# Patient Record
Sex: Female | Born: 1995 | Race: White | Hispanic: No | Marital: Single | Smoking: Never smoker
Health system: Southern US, Community
[De-identification: ages and names within clinical notes are randomized; demographics above are authoritative.]

## PROBLEM LIST (undated history)

## (undated) DIAGNOSIS — R6250 Unspecified lack of expected normal physiological development in childhood: Secondary | ICD-10-CM

## (undated) DIAGNOSIS — R569 Unspecified convulsions: Secondary | ICD-10-CM

## (undated) DIAGNOSIS — E039 Hypothyroidism, unspecified: Secondary | ICD-10-CM

## (undated) DIAGNOSIS — H547 Unspecified visual loss: Secondary | ICD-10-CM

## (undated) HISTORY — PX: OTHER SURGICAL HISTORY: SHX169

## (undated) HISTORY — DX: Unspecified convulsions: R56.9

## (undated) HISTORY — DX: Unspecified visual loss: H54.7

## (undated) HISTORY — DX: Hypothyroidism, unspecified: E03.9

## (undated) HISTORY — DX: Unspecified lack of expected normal physiological development in childhood: R62.50

---
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
## 2015-01-14 ENCOUNTER — Encounter: Payer: Self-pay | Admitting: Family Medicine

## 2015-01-14 ENCOUNTER — Ambulatory Visit (INDEPENDENT_AMBULATORY_CARE_PROVIDER_SITE_OTHER): Payer: Medicaid Other | Admitting: Family Medicine

## 2015-01-14 VITALS — BP 75/55 | HR 63 | Temp 95.9°F | Wt <= 1120 oz

## 2015-01-14 DIAGNOSIS — G801 Spastic diplegic cerebral palsy: Secondary | ICD-10-CM

## 2015-01-14 DIAGNOSIS — Z23 Encounter for immunization: Secondary | ICD-10-CM | POA: Diagnosis not present

## 2015-01-14 DIAGNOSIS — E038 Other specified hypothyroidism: Secondary | ICD-10-CM

## 2015-01-14 NOTE — Progress Notes (Signed)
Subjective:  Patient ID: Abigail Hartman, female    DOB: [birth year]  Age: 19 y.o. MRN: [id number]  CC: Weight Gain   HPI Abigail Hartman presents for weight gain irregular menses.Size went from 7-8 to 12 in 1.5 mos this is just since she was taken off her thyroid medicine. Mom was told that the patient is having periods that she did not need to be on thyroid medication if no other known symptoms of thyroid disease have been expressed in that activity has not been changed although activities very much limited by being wheelchair-bound by her cerebral palsy. There is no excessive constipation. There is no hair loss.  History Hatsue has a past medical history of Blind; Seizures; Hypothyroidism; and Developmental delay, severe.   She has past surgical history that includes GI TUBE (2011).   Her family history is not on file.She reports that she has been passively smoking.  She has never used smokeless tobacco. She reports that she does not drink alcohol or use illicit drugs.  Outpatient Prescriptions Prior to Visit  Medication Sig Dispense Refill  . acetaminophen (TYLENOL) 160 MG/5ML solution Take 240 mg by mouth every 6 (six) hours as needed for fever.    . Amantadine HCl 100 MG tablet Take 0.5 tablets (50 mg total) by mouth 2 (two) times daily. 31 tablet 5  . cloNIDine HCl (KAPVAY) 0.1 MG TB12 ER tablet 1 TAB AM, 1 TAB PM, 2 TABS QHS 124 tablet 5  . diazepam (DIASTAT ACUDIAL) 10 MG GEL Place 5 mg rectally.    . Ergocalciferol (VITAMIN D2) 2000 UNITS TABS Take by mouth.    . hydrOXYzine (ATARAX/VISTARIL) 25 MG tablet One tablet by mouth 3 times daily as needed 90 tablet 5  . nystatin cream (MYCOSTATIN) Apply topically 2 (two) times daily. 30 g 5  . Pseudoephedrine-APAP-DM (DAYQUIL PO) Take 10 mLs by mouth daily as needed (cold).    . ranitidine (ZANTAC) 150 MG tablet TAKE ONE TABLET BY MOUTH TWICE DAILY 60 tablet 5  . tizanidine (ZANAFLEX) 2 MG capsule Take 1 capsule at nighttime 31 capsule 5  .  zonisamide (ZONEGRAN) 100 MG capsule Take 2 capsules at nighttime 62 capsule 5   No facility-administered medications prior to visit.    ROS Review of Systems  Constitutional: Negative for fever, activity change and appetite change.  HENT: Negative for congestion, rhinorrhea and sore throat.   Eyes: Negative for pain and visual disturbance.  Respiratory: Negative for cough and shortness of breath.   Gastrointestinal: Negative for nausea and abdominal pain.  Musculoskeletal: Negative for myalgias and arthralgias.    Objective:  BP 75/55 mmHg  Pulse 63  Temp(Src) 95.9 F (35.5 C) (Axillary)  Wt 61 lb (27.669 kg)  LMP 12/18/2014  BP Readings from Last 3 Encounters:  01/14/15 75/55  12/18/14 84/62  12/03/14 82/56    Wt Readings from Last 3 Encounters:  01/14/15 61 lb (27.669 kg) (0 %*, Z = -8.58)  12/18/14 65 lb (29.484 kg) (0 %*, Z = -7.41)  12/03/14 61 lb (27.669 kg) (0 %*, Z = -8.65)   * Growth percentiles are based on CDC 2-20 Years data.     Physical Exam  Constitutional: She appears well-developed and well-nourished. No distress.  Small for age. Wheelchair bound. Safety belted due to lack of coordination. Smiling bright eyed with head swaying from side-to-side constantly rhythmically through the whole visit. No other form of communication.  HENT:  Head: Normocephalic and atraumatic.  Eyes: Conjunctivae are normal.  Pupils are equal, round, and reactive to light.  Neck: Normal range of motion. Neck supple. No thyromegaly present.  Cardiovascular: Normal rate, regular rhythm and normal heart sounds.   No murmur heard. Pulmonary/Chest: Effort normal and breath sounds normal. No respiratory distress. She has no wheezes. She has no rales.  Abdominal: Soft. Bowel sounds are normal. She exhibits no distension. There is no tenderness.  Musculoskeletal: Normal range of motion.  Lymphadenopathy:    She has no cervical adenopathy.  Neurological: She is alert. She exhibits  abnormal muscle tone. Coordination abnormal.  Skin: Skin is warm and dry.  Psychiatric: Her affect is labile. Cognition and memory are impaired. She is noncommunicative.    No results found for: HGBA1C  Lab Results  Component Value Date   WBC 7.8 12/18/2014   HGB 11.5* 12/18/2014   HCT 35.1* 12/18/2014   PLT 304 12/18/2014   GLUCOSE 99 12/18/2014   ALT 18 12/18/2014   AST 22 12/18/2014   NA 137 12/18/2014   K 3.9 12/18/2014   CL 109 12/18/2014   CREATININE 0.35* 12/18/2014   BUN 16 12/18/2014   CO2 22 12/18/2014    Dg Chest Portable 1 View  12/18/2014  CLINICAL DATA:  Unresponsive, seizure, cough last week EXAM: PORTABLE CHEST - 1 VIEW COMPARISON:  03/03/2004 FINDINGS: The heart size and mediastinal contours are within normal limits. Both lungs are clear. The visualized skeletal structures are unremarkable. IMPRESSION: No active disease. Electronically Signed   By: Skipper Cliche M.D.   On: 12/18/2014 09:29    Assessment & Plan:   Eknoor was seen today for weight gain.  Diagnoses and all orders for this visit:  Other specified hypothyroidism -     T4, Free -     CBC with Differential/Platelet -     CMP14+EGFR -     TSH  Encounter for immunization  Hypoxic ischemic encephalopathy, neonatal onset, severe  Spastic diplegia (HCC)  Other orders -     Flu Vaccine QUAD 36+ mos IM   I am having Ms. Herro maintain her Vitamin D2, diazepam, hydrOXYzine, nystatin cream, ranitidine, zonisamide, tizanidine, cloNIDine HCl, Amantadine HCl, acetaminophen, and Pseudoephedrine-APAP-DM (DAYQUIL PO).  No orders of the defined types were placed in this encounter.     Follow-up: Return if symptoms worsen or fail to improve.  Abigail Hartman, M.D.

## 2015-01-15 ENCOUNTER — Telehealth: Payer: Self-pay | Admitting: Family Medicine

## 2015-01-15 ENCOUNTER — Telehealth: Payer: Self-pay | Admitting: *Deleted

## 2015-01-15 ENCOUNTER — Other Ambulatory Visit: Payer: Self-pay | Admitting: Family Medicine

## 2015-01-15 LAB — CBC WITH DIFFERENTIAL/PLATELET
BASOS ABS: 0 10*3/uL (ref 0.0–0.2)
BASOS: 0 %
EOS (ABSOLUTE): 0.2 10*3/uL (ref 0.0–0.4)
Eos: 4 %
Hematocrit: 38.2 % (ref 34.0–46.6)
Hemoglobin: 12.3 g/dL (ref 11.1–15.9)
IMMATURE GRANS (ABS): 0 10*3/uL (ref 0.0–0.1)
IMMATURE GRANULOCYTES: 0 %
LYMPHS: 31 %
Lymphocytes Absolute: 1.9 10*3/uL (ref 0.7–3.1)
MCH: 27.9 pg (ref 26.6–33.0)
MCHC: 32.2 g/dL (ref 31.5–35.7)
MCV: 87 fL (ref 79–97)
MONOS ABS: 0.5 10*3/uL (ref 0.1–0.9)
Monocytes: 9 %
NEUTROS PCT: 56 %
Neutrophils Absolute: 3.5 10*3/uL (ref 1.4–7.0)
PLATELETS: 286 10*3/uL (ref 150–379)
RBC: 4.41 x10E6/uL (ref 3.77–5.28)
RDW: 14.9 % (ref 12.3–15.4)
WBC: 6.2 10*3/uL (ref 3.4–10.8)

## 2015-01-15 LAB — CMP14+EGFR
A/G RATIO: 1.6 (ref 1.1–2.5)
ALT: 16 IU/L (ref 0–32)
AST: 23 IU/L (ref 0–40)
Albumin: 4.4 g/dL (ref 3.5–5.5)
Alkaline Phosphatase: 146 IU/L — ABNORMAL HIGH (ref 39–117)
BILIRUBIN TOTAL: 0.2 mg/dL (ref 0.0–1.2)
BUN/Creatinine Ratio: 24 — ABNORMAL HIGH (ref 8–20)
BUN: 13 mg/dL (ref 6–20)
CALCIUM: 9.4 mg/dL (ref 8.7–10.2)
CHLORIDE: 102 mmol/L (ref 97–106)
CO2: 21 mmol/L (ref 18–29)
Creatinine, Ser: 0.55 mg/dL — ABNORMAL LOW (ref 0.57–1.00)
GFR calc Af Amer: 157 mL/min/{1.73_m2} (ref >59)
GFR calc non Af Amer: 136 mL/min/{1.73_m2} (ref >59)
GLUCOSE: 93 mg/dL (ref 65–99)
Globulin, Total: 2.8 g/dL (ref 1.5–4.5)
POTASSIUM: 4.2 mmol/L (ref 3.5–5.2)
Sodium: 141 mmol/L (ref 136–144)
TOTAL PROTEIN: 7.2 g/dL (ref 6.0–8.5)

## 2015-01-15 LAB — T4, FREE: FREE T4: 0.66 ng/dL — AB (ref 0.93–1.60)

## 2015-01-15 LAB — TSH: TSH: 5.17 u[IU]/mL — AB (ref 0.450–4.500)

## 2015-01-15 MED ORDER — LEVOTHYROXINE SODIUM 50 MCG PO TABS: 50.0000 ug | ORAL_TABLET | Freq: Every day | ORAL | Status: DC

## 2015-01-15 NOTE — Telephone Encounter (Signed)
-----   Message from Mechele ClaudeWarren Stacks, MD sent at 01/15/2015  9:57 AM EDT -----  thyroid result shows that your thyroid does not produce enough thyroxine, the thyroid hormone that gives you energy. I have sent in levothyroxin 50 g daily this should help with the patient's weight gain.. I would like to see her again in approximately 6-8 weeks for this. Come by a couple of days early if possible to have your blood test, drawn so we can go over the results when you arrive for your appointment.   Nurse, please put in future order for TSH and free T4. Thanks, WS.  Best Regards, Mechele ClaudeWarren Stacks, M.D.

## 2015-01-16 NOTE — Addendum Note (Signed)
Addended by: Tamera PuntWRAY, Darcy Cordner S on: 01/16/2015 08:49 AM   Modules accepted: Orders

## 2015-01-16 NOTE — Telephone Encounter (Signed)
Patient mom aware of results.

## 2015-02-14 ENCOUNTER — Other Ambulatory Visit: Payer: Self-pay | Admitting: Pediatrics

## 2015-03-15 ENCOUNTER — Other Ambulatory Visit: Payer: Self-pay | Admitting: Nurse Practitioner

## 2015-06-02 ENCOUNTER — Ambulatory Visit (INDEPENDENT_AMBULATORY_CARE_PROVIDER_SITE_OTHER): Payer: Medicaid Other | Admitting: Pediatrics

## 2015-06-02 ENCOUNTER — Encounter: Payer: Self-pay | Admitting: Pediatrics

## 2015-06-02 VITALS — BP 86/58 | HR 106 | Temp 97.4°F | Ht <= 58 in | Wt <= 1120 oz

## 2015-06-02 DIAGNOSIS — Z0000 Encounter for general adult medical examination without abnormal findings: Secondary | ICD-10-CM

## 2015-06-02 NOTE — Progress Notes (Signed)
    Subjective:    Patient ID: Abigail Hartman, female    DOB: 23-Nov-1995, 20 y.o.   MRN: [id number]  CC: special olympic form   HPI: Abigail Hartman is a 20 y.o. female presenting for special olympic form Pt with h/o cerebral palsy and septic-optic nerve dysplasia, developmental delay.  Pt can walk, can raise leg to get into bathtub In wheel chair much of the time Sometimes can walk on her own, gets tired, sometimes will sit down  No seizures since leaving hospital 5 yrs ago for double pneumonia  Eating by mouth every 2-3 hours Gtube removed, had been placed during prolonged hospitalization when failed swallow test Now with much better control of swallow  Non-verbal, will yell when she needs diaper change  Recognizes her GM and GF voices. Sleep as improved.  UTD immunizations  Hypothyroid: followed at [facility]  Septic optic-dysplasia: followed by Dr. Sharene SkeansHickling  Contracture L upper arm, L side more affected than R per GM who is adopted parent and guardian.  She is having regular periods, tend to be very light. GM had thought about birth control but now that the periods are light she says they dont seem to bother [name] and are manageable.  Relevant past medical, surgical, family and social history reviewed and updated as indicated. Interim medical history since our last visit reviewed. Allergies and medications reviewed and updated.    ROS: Per HPI unless specifically indicated above  History  Smoking status  . Passive Smoke Exposure - Never Smoker  Smokeless tobacco  . Never Used    Past Medical History Patient Active Problem List   Diagnosis Date Noted  . Spastic diplegia (HCC) 12/03/2014  . Epilepsy, generalized, convulsive (HCC) 12/03/2014  . Insomnia 12/03/2014  . Cerebral palsy (HCC) 05/07/2014  . Hypoxic ischemic encephalopathy, neonatal onset, severe 04/26/2014  . Optic nerve hypoplasia of both eyes 04/26/2014  . Blindness 04/26/2014  . Severe intellectual  disability 04/26/2014  . GERD (gastroesophageal reflux disease) 07/18/2012  . Seizures (HCC) 07/18/2012  . Hypothyroidism 07/18/2012        Objective:    BP 86/58 mmHg  Pulse 106  Temp(Src) 97.4 F (36.3 C) (Oral)  Ht 4' (1.219 m)  Wt 62 lb (28.123 kg)  BMI 18.93 kg/m2  Wt Readings from Last 3 Encounters:  06/02/15 62 lb (28.123 kg) (0 %*, Z = -8.05)  01/14/15 61 lb (27.669 kg) (0 %*, Z = -8.58)  12/18/14 65 lb (29.484 kg) (0 %*, Z = -7.41)   * Growth percentiles are based on CDC 2-20 Years data.     Gen: In wheelchair, non verbral, responds to grandmothers voice, smiling, eyes roving constantly, in NAD, alert.  EYES: no scleral injection or icterus ENT:  TMs pearly gray b/l, OP without erythema LYMPH: no cervical LAD CV: NRRR, normal S1/S2, no murmur, distal pulses 2+ b/l Resp: CTABL, no wheezes, normal WOB Abd: +BS, soft, NTND.  Ext: No edema, warm Neuro: follows some simple direction directions such as open your mouth, moves all extremities, can't straighten L arm, no clonus     Assessment & Plan:    Kaelen was seen today for special olympic form and WCC. Form completed. GM's questions answered.   Diagnoses and all orders for this visit:  Health check for child over 7328 days old  Follow up plan: 1 yr for yearly exam, sooner if needed  Rex Krasarol Josemiguel Gries, MD [facility] 06/02/2015, 3:03 PM

## 2015-06-13 ENCOUNTER — Other Ambulatory Visit: Payer: Self-pay | Admitting: Pediatrics

## 2015-06-30 ENCOUNTER — Ambulatory Visit: Payer: Medicaid Other | Admitting: Pediatrics

## 2015-07-13 ENCOUNTER — Other Ambulatory Visit: Payer: Self-pay | Admitting: Family Medicine

## 2015-07-18 ENCOUNTER — Telehealth: Payer: Self-pay

## 2015-07-18 DIAGNOSIS — G801 Spastic diplegic cerebral palsy: Secondary | ICD-10-CM

## 2015-07-18 MED ORDER — TIZANIDINE HCL 2 MG PO CAPS: ORAL_CAPSULE | ORAL | Status: DC

## 2015-07-18 NOTE — Telephone Encounter (Signed)
Patient's mother called stating that she needs a refill on the patient's Tizanidine 2mg   [phone]

## 2015-07-18 NOTE — Telephone Encounter (Signed)
Rx sent electronically. TG 

## 2015-07-30 ENCOUNTER — Ambulatory Visit (INDEPENDENT_AMBULATORY_CARE_PROVIDER_SITE_OTHER): Payer: Medicaid Other | Admitting: Pediatrics

## 2015-07-30 ENCOUNTER — Encounter: Payer: Self-pay | Admitting: Pediatrics

## 2015-07-30 VITALS — BP 100/70 | HR 76 | Ht <= 58 in | Wt <= 1120 oz

## 2015-07-30 DIAGNOSIS — G801 Spastic diplegic cerebral palsy: Secondary | ICD-10-CM

## 2015-07-30 DIAGNOSIS — G40309 Generalized idiopathic epilepsy and epileptic syndromes, not intractable, without status epilepticus: Secondary | ICD-10-CM

## 2015-07-30 DIAGNOSIS — F72 Severe intellectual disabilities: Secondary | ICD-10-CM | POA: Diagnosis not present

## 2015-07-30 DIAGNOSIS — H47033 Optic nerve hypoplasia, bilateral: Secondary | ICD-10-CM

## 2015-07-30 MED ORDER — CLONIDINE HCL ER 0.1 MG PO TB12: ORAL_TABLET | ORAL | Status: DC

## 2015-07-30 MED ORDER — TIZANIDINE HCL 2 MG PO CAPS: ORAL_CAPSULE | ORAL | Status: DC

## 2015-07-30 MED ORDER — ZONISAMIDE 100 MG PO CAPS: ORAL_CAPSULE | ORAL | Status: DC

## 2015-07-30 MED ORDER — AMANTADINE HCL 100 MG PO TABS: 50.0000 mg | ORAL_TABLET | Freq: Two times a day (BID) | ORAL | Status: DC

## 2015-07-30 NOTE — Progress Notes (Signed)
Patient: Abigail Hartman MRN: [id number] Sex: female DOB: 03-12-1996  Provider: Deetta Perla, MD Location of Care: [facility] Child Neurology  Note type: Routine return visit  History of Present Illness: Referral Source: Dr. Rudi Heap History from: mother, patient and [facility] chart Chief Complaint: Anoxic Brain Damage/Severe Developmental Delay  Abigail Hartman is a 20 y.o. female who Abigail Hartman is a 20 y.o. female who returns on Jul 30, 2015 for the first time since December 03, 2014. Kortnie previously received care at [facility]. 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 She has a history of generalized tonic-clonic seizures.On December 18, 2014 she experienced her first breakthrough seizure in ~5 years. She returned to her baseline later that day and has had no seizure-like activity reported since.   Her general health has been good.Her appetite is stable and she is up 1 lb since our last visit. She sleeps well most nights (~8-9hrs); sleeping through the night. Over the past 2 months she has been having weekly episodes of insomnia. These periods last about 48 hrs and she will not sleep during those days. The longest episode lasted 4 days. These episodes occur once a week. Her mother denies any other symptoms during those times or during the periods before and after the episode. Mother has not identified any triggers for these symptoms.   Review of Systems: 12 system review was assessed and was negative  Past Medical History Diagnosis Date  . Blind   . Seizures (HCC)   . Hypothyroidism   . Developmental delay, severe    Hospitalizations: No., Head Injury: No., Nervous System Infections: No., Immunizations up to date: Yes.    Her last seizure occurred on December 18, 2014; prior to that 2011;  seizures were generalized tonic-clonic in nature.  She has problems with insomnia and sometimes will be awake for three or four days straight and then sleep.  I reviewed her last evaluation at [facility]. Her last video EEG was poorly organized and showed delta and beta range activity. There were four push button events with no EEG seizure correlates and she had frequent eye movement artifact.  MRI scan of the brain at [facility] showed an absent septum pellucidum and hypoplastic optic nerves consistent with the condition noted septo-optic dysplasia.  In the past, she had numerous hospitalizations for recurrent seizures, pneumonia, and at one point had a problem with aspiration pneumonia and a gastrostomy tube was placed for about one to two years. It was removed about two years ago.  She is followed by Joni Fears at [facility] for endocrinology and also by pediatric nephrology at [facility]. She has also been followed at [facility] by orthopedic surgery and physical therapy.  Birth History 7 lbs. 14 oz. infant born at [redacted] weeks gestational age to a 20 year old primigravida Gestation was uncomplicated Normal spontaneous vaginal delivery Nursery Course was complicated by tight nuchal cord without heart rate of breath at birth requiring resuscitation focal seizures followed by cardiac arrest prolonged hospitalization dysphagia and quadriparesis, blindness Growth and Development was recalled as globally delayed  Behavior History none  Surgical History Procedure Laterality Date  . Gi tube  2011   Family History family history is not on file. Family history is negative for migraines, seizures, intellectual disabilities, blindness, deafness, birth defects, chromosomal disorder, or autism.  Social History .  Marital Status: Single    Spouse Name: N/A  . Number of Children: N/A  . Years of Education: N/A   Social History Main Topics  . Smoking status: Passive Smoke  Exposure - Never Smoker  . Smokeless tobacco: Never Used  . Alcohol Use: No  . Drug Use: No  . Sexual Activity: No   Social History Narrative   Educational level 12th grade   School Attending: [employer].  Occupation: Consulting civil engineer  Living with both parents   Hobbies/Interest: Abigail Hartman enjoys music and loves you reading to her.  School comments: [place] does well in school.  Allergies Allergen Reactions  . Motrin [Ibuprofen] Rash   Physical Exam BP 100/70 mmHg  Pulse 76  Ht 4' (1.219 m)  Wt 62 lb (28.123 kg)  BMI 18.93 kg/m2  General: Well-developed well-nourished young woman in no acute distress, brown hair, brown eyes, right handed Head: Microcephalic. No dysmorphic features Ears, Nose and Throat: No signs of infection in conjunctivae, tympanic membranes, nasal passages, or oropharynx Neck: Supple neck with full range of motion; no cranial or cervical bruits Respiratory: Lungs clear to auscultation. Cardiovascular: Regular rate and rhythm, no murmurs, gallops, or rubs; pulses normal in the upper and lower extremities Musculoskeletal: No deformities, edema, cyanosis, mild increased tone left greater than right, slight fixed contracture in the left arm at the elbow and left leg at the knee; tight left greater than right heel cords Skin: No lesions Trunk: Soft, non-tender, normal bowel sounds, no hepatosplenomegaly  Neurologic Exam  Mental Status: Awake, alert, aware of the examiner, smiling, unable to fix or follow Cranial Nerves: Pupils equal, round, and non-reactive to light; fundoscopic examination shows positive red reflex bilaterally; horizontal nystagmus, may orient somewhat to sound, does not blink to bright light or fix on faces or colorful objects, symmetric facial strength; midline tongue and uvula Motor: Quadriparesis with preservation of function in the right hand, spasticity that is mild; sitting in wheelchair  Sensory: Withdrawal in all extremities to  noxious stimuli. Coordination: No tremor does not reach for objects Reflexes: Symmetric and diminished; bilateral equivocal plantar responses; absent protective reflexes. Gait: Broad-based, able to walk independently but shifts her balance to a point of occasionally losing it. She is more stable when one hand is held.  Assessment 1.   Generalized convulsive epilepsy, G40.309 2.   Spastic diplegia, G80.9 3.   Intellectual disability, Severe, F72 4.   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 5.   Insomnia G47.00.  Discussion Severe intellectual disability with septo-optic dysplasia and a reported history of hypoxic ischemic encephalopathy (at birth). Having increased symptoms of insomnia. No changes or additions to her medications at this time. She is still taking clonidine at night which should help with initiation of sleep, unfortunately there is not a good/safe way to treat sleep maintenance in this patient. If these symptoms worsen then we may have to look further into making some changes.   Regarding her recent seizure. There have not been any other reports of breakthrough seizure activity. We will continue the current doses and will monitor.   Plan Magali will be asked to see [place] back in this office in 6 months. Refills have been provided for Zonisamide, Tizanidine, clonidine, and amantadine. We will continue to monitor her sleep schedule but she is ultimately in good health.    Medication List   This list is accurate as of: 07/30/15  3:40 PM.       acetaminophen 160 MG/5ML solution  Commonly  known as:  TYLENOL  Take 240 mg by mouth every 6 (six) hours as needed for fever.     Amantadine HCl 100 MG tablet  TAKE ONE-HALF TABLET BY MOUTH TWICE DAILY     cloNIDine HCl 0.1 MG Tb12 ER tablet  Commonly known as:  KAPVAY  TAKE ONE TABLET BY MOUTH IN THE MORNING AND ONE IN THE EVENING AND TWO AT BEDTIME     DAYQUIL PO  Take 10 mLs by mouth daily as needed (cold). Reported on 07/30/2015      diazepam 10 MG Gel  Commonly known as:  DIASTAT ACUDIAL  Place 5 mg rectally.     hydrOXYzine 25 MG tablet  Commonly known as:  ATARAX/VISTARIL  TAKE ONE TABLET BY MOUTH THREE TIMES DAILY AS NEEDED     levothyroxine 50 MCG tablet  Commonly known as:  SYNTHROID, LEVOTHROID  Take 1 tablet (50 mcg total) by mouth daily.     nystatin cream  Commonly known as:  MYCOSTATIN  Apply topically 2 (two) times daily.     ranitidine 150 MG tablet  Commonly known as:  ZANTAC  TAKE ONE TABLET BY MOUTH TWICE DAILY     tizanidine 2 MG capsule  Commonly known as:  ZANAFLEX  Take 1 capsule at nighttime     Vitamin D2 2000 units Tabs  Take by mouth.     zonisamide 100 MG capsule  Commonly known as:  ZONEGRAN  TAKE TWO CAPSULES BY MOUTH ONCE DAILY AT BEDTIME      The medication list was reviewed and reconciled. All changes or newly prescribed medications were explained.  A complete medication list was provided to the patient/caregiver.  Kathee DeltonIan D McKeag, MD,MS,  PGY2 07/30/2015 4:59 PM   30 minutes of face-to-face time was spent with Rheana and her mother, more than half of it in consultation.  I performed physical examination, participated in history taking, and guided decision making.  Deanna ArtisWilliam H. Sharene SkeansHickling, MD

## 2015-07-31 ENCOUNTER — Encounter: Payer: Self-pay | Admitting: Pediatrics

## 2015-08-11 ENCOUNTER — Other Ambulatory Visit: Payer: Self-pay | Admitting: Family

## 2015-08-11 DIAGNOSIS — F72 Severe intellectual disabilities: Secondary | ICD-10-CM

## 2015-08-11 MED ORDER — CLONIDINE HCL ER 0.1 MG PO TB12: ORAL_TABLET | ORAL | Status: DC

## 2015-09-07 ENCOUNTER — Other Ambulatory Visit: Payer: Self-pay | Admitting: Family

## 2015-11-17 ENCOUNTER — Telehealth: Payer: Self-pay | Admitting: Family Medicine

## 2015-11-21 NOTE — Telephone Encounter (Signed)
Faxed 11/21/15

## 2015-11-24 ENCOUNTER — Telehealth: Payer: Self-pay | Admitting: Family Medicine

## 2015-11-24 NOTE — Telephone Encounter (Signed)
Faxed signed orders to [facility]

## 2015-12-08 ENCOUNTER — Ambulatory Visit: Payer: Medicaid Other | Admitting: Family Medicine

## 2015-12-15 ENCOUNTER — Other Ambulatory Visit: Payer: Self-pay | Admitting: Family Medicine

## 2016-01-12 ENCOUNTER — Other Ambulatory Visit: Payer: Self-pay | Admitting: Family Medicine

## 2016-01-12 ENCOUNTER — Other Ambulatory Visit: Payer: Self-pay | Admitting: Pediatrics

## 2016-01-13 ENCOUNTER — Telehealth: Payer: Self-pay | Admitting: Family Medicine

## 2016-01-13 ENCOUNTER — Encounter: Payer: Self-pay | Admitting: Family Medicine

## 2016-01-13 ENCOUNTER — Ambulatory Visit (INDEPENDENT_AMBULATORY_CARE_PROVIDER_SITE_OTHER): Payer: Medicaid Other | Admitting: Family Medicine

## 2016-01-13 ENCOUNTER — Other Ambulatory Visit: Payer: Self-pay | Admitting: Family Medicine

## 2016-01-13 VITALS — BP 98/65 | HR 111 | Temp 97.9°F

## 2016-01-13 DIAGNOSIS — K0510 Chronic gingivitis, plaque induced: Secondary | ICD-10-CM

## 2016-01-13 DIAGNOSIS — G809 Cerebral palsy, unspecified: Secondary | ICD-10-CM | POA: Diagnosis not present

## 2016-01-13 MED ORDER — CIPROFLOXACIN HCL 250 MG PO TABS: 250.0000 mg | ORAL_TABLET | Freq: Two times a day (BID) | ORAL | 0 refills | Status: DC

## 2016-01-13 NOTE — Progress Notes (Signed)
Subjective:  Patient ID: Abigail NatalMyah Hartman, female    DOB: 08/25/1995  Age: 20 y.o. MRN: [id number]  CC: Altered Mental Status (pt here today c/o crying after eating and Abigail Hartman hasn't had the same appetite as normal.)   HPI Abigail Hartman presents for Patient who is unable to express her wishes or concerns due to birth injury. Has symptoms above. Abigail Hartman is eating and holding her food down but Abigail Hartman is crying as if in pain when Abigail Hartman eats. Bowels are moving normally. Abigail Hartman's not had any vomiting. Abigail Hartman has noticed some blood at the lips. Patient has not had any fever chills or sweats. Abigail Hartman has a history of bowel obstruction but symptoms included vomiting at that time. The irritability has been present for 2 days and seems to be stable.   History Abigail Hartman has a past medical history of Blind; Developmental delay, severe; Hypothyroidism; and Seizures (HCC).   Abigail Hartman has a past surgical history that includes GI TUBE (2011).   Her family history is not on file.Abigail Hartman reports that Abigail Hartman is a non-smoker but has been exposed to tobacco smoke. Abigail Hartman has never used smokeless tobacco. Abigail Hartman reports that Abigail Hartman does not drink alcohol or use drugs.    ROS Review of Systems  Unable to perform ROS: Patient nonverbal    Objective:  BP 98/65   Pulse (!) 111   Temp 97.9 F (36.6 C) (Oral)   BP Readings from Last 3 Encounters:  01/13/16 98/65  07/30/15 100/70  06/02/15 (!) 86/58    Wt Readings from Last 3 Encounters:  07/30/15 62 lb (28.1 kg) (<1 %, Z < -2.33)*  06/02/15 62 lb (28.1 kg) (<1 %, Z < -2.33)*  01/14/15 61 lb (27.7 kg) (<1 %, Z < -2.33)*   * Growth percentiles are based on CDC 2-20 Years data.     Physical Exam  Constitutional: Abigail Hartman appears well-nourished. Abigail Hartman appears distressed (minimally).  HENT:  Head: Normocephalic and atraumatic.  Blood noted on the anterior teeth and margin of the gums. Taper inspection reveals there is some excoriation and denuded epithelium at the lateral aspect of the gingiva at the base  of the left upper second bicuspid and first molar.     Lab Results  Component Value Date   WBC 6.2 01/14/2015   HGB 11.5 (L) 12/18/2014   HCT 38.2 01/14/2015   PLT 286 01/14/2015   GLUCOSE 93 01/14/2015   ALT 16 01/14/2015   AST 23 01/14/2015   NA 141 01/14/2015   K 4.2 01/14/2015   CL 102 01/14/2015   CREATININE 0.55 (L) 01/14/2015   BUN 13 01/14/2015   CO2 21 01/14/2015   TSH 5.170 (H) 01/14/2015    Dg Chest Portable 1 View  Result Date: 12/18/2014 CLINICAL DATA:  Unresponsive, seizure, cough last week EXAM: PORTABLE CHEST - 1 VIEW COMPARISON:  03/03/2004 FINDINGS: The heart size and mediastinal contours are within normal limits. Both lungs are clear. The visualized skeletal structures are unremarkable. IMPRESSION: No active disease. Electronically Signed   By: Esperanza Heiraymond  Rubner M.D.   On: 12/18/2014 09:29    Assessment & Plan:   Temara was seen today for altered mental status.  Diagnoses and all orders for this visit:  Gingivitis  Cerebral palsy, unspecified type (HCC)  Other orders -     ciprofloxacin (CIPRO) 250 MG tablet; Take 1 tablet (250 mg total) by mouth 2 (two) times daily.    Gently cleanse the area frequently with warm salt water. This should occur after  all oral intake. Consider dental consultation.  I have discontinued Abigail Hartman's nystatin cream. I am also having her start on ciprofloxacin. Additionally, I am having her maintain her Vitamin D2, diazepam, acetaminophen, Amantadine HCl, zonisamide, tizanidine, cloNIDine HCl, hydrOXYzine, ranitidine, and levothyroxine.  Meds ordered this encounter  Medications  . ciprofloxacin (CIPRO) 250 MG tablet    Sig: Take 1 tablet (250 mg total) by mouth 2 (two) times daily.    Dispense:  10 tablet    Refill:  0     Follow-up: Return if symptoms worsen or fail to improve.  Mechele Claude, M.D.

## 2016-01-13 NOTE — Telephone Encounter (Signed)
Please contact the agents mother. None of the medications on her medication list are commonly used for urine flow or constipation. Because of this, I am not sure which medicine that you are referring to. If you could get me the name and dosage I would happily consider refilling

## 2016-01-13 NOTE — Telephone Encounter (Signed)
Authorize 30 days only. Then contact the patient letting them know that they will need an appointment before any further prescriptions can be sent in. 

## 2016-01-14 ENCOUNTER — Other Ambulatory Visit: Payer: Self-pay | Admitting: Family Medicine

## 2016-01-14 MED ORDER — POLYETHYLENE GLYCOL 3350 17 GM/SCOOP PO POWD: 17.0000 g | Freq: Every day | ORAL | 5 refills | Status: DC | PRN

## 2016-01-14 NOTE — Telephone Encounter (Signed)
Aware , script was sent in. 

## 2016-01-14 NOTE — Telephone Encounter (Signed)
Need medicine to keep her bowels going normal.   The name of the medicine is polyethylene glycol powder 335?Marland Kitchen.    Take one capful daily as needed. Please send script to [facility].

## 2016-01-14 NOTE — Telephone Encounter (Signed)
I sent in the requested prescription 

## 2016-01-15 ENCOUNTER — Telehealth (INDEPENDENT_AMBULATORY_CARE_PROVIDER_SITE_OTHER): Payer: Self-pay | Admitting: Pediatrics

## 2016-01-15 ENCOUNTER — Other Ambulatory Visit (INDEPENDENT_AMBULATORY_CARE_PROVIDER_SITE_OTHER): Payer: Self-pay | Admitting: Family

## 2016-01-15 MED ORDER — AMANTADINE HCL 50 MG/5ML PO SYRP: ORAL_SOLUTION | ORAL | 1 refills | Status: DC

## 2016-01-15 NOTE — Telephone Encounter (Signed)
I changed the Amantadine tablets to suspension as Medicaid covers capsules and suspension. TG

## 2016-01-15 NOTE — Telephone Encounter (Signed)
-----   Message from Elveria Risingina Goodpasture, NP sent at 01/15/2016  9:34 AM EDT ----- Regarding: Needs appointment Susi needs an appointment with Dr Sharene SkeansHickling. November is fine.  Thanks,  Inetta Fermoina

## 2016-01-15 NOTE — Telephone Encounter (Signed)
Scheduled appt 02/13/16 at 11:00am

## 2016-02-10 ENCOUNTER — Telehealth: Payer: Self-pay | Admitting: Pediatrics

## 2016-02-10 NOTE — Telephone Encounter (Signed)
Faxed office notes to  [facility] at [phone]

## 2016-02-13 ENCOUNTER — Ambulatory Visit (INDEPENDENT_AMBULATORY_CARE_PROVIDER_SITE_OTHER): Payer: Medicaid Other | Admitting: Pediatrics

## 2016-02-14 ENCOUNTER — Other Ambulatory Visit: Payer: Self-pay | Admitting: Family Medicine

## 2016-02-14 ENCOUNTER — Other Ambulatory Visit: Payer: Self-pay | Admitting: Pediatrics

## 2016-02-17 ENCOUNTER — Other Ambulatory Visit (INDEPENDENT_AMBULATORY_CARE_PROVIDER_SITE_OTHER): Payer: Self-pay | Admitting: Family

## 2016-02-17 MED ORDER — AMANTADINE HCL 50 MG/5ML PO SYRP: ORAL_SOLUTION | ORAL | 1 refills | Status: DC

## 2016-02-22 IMAGING — CR DG CHEST 1V PORT
1 series · 1 of 1 positions shown · non-contrast
Comparison: 03/03/2004

CLINICAL DATA: Unresponsive, seizure, cough last week

EXAM:
PORTABLE CHEST - 1 VIEW

[ap portable]
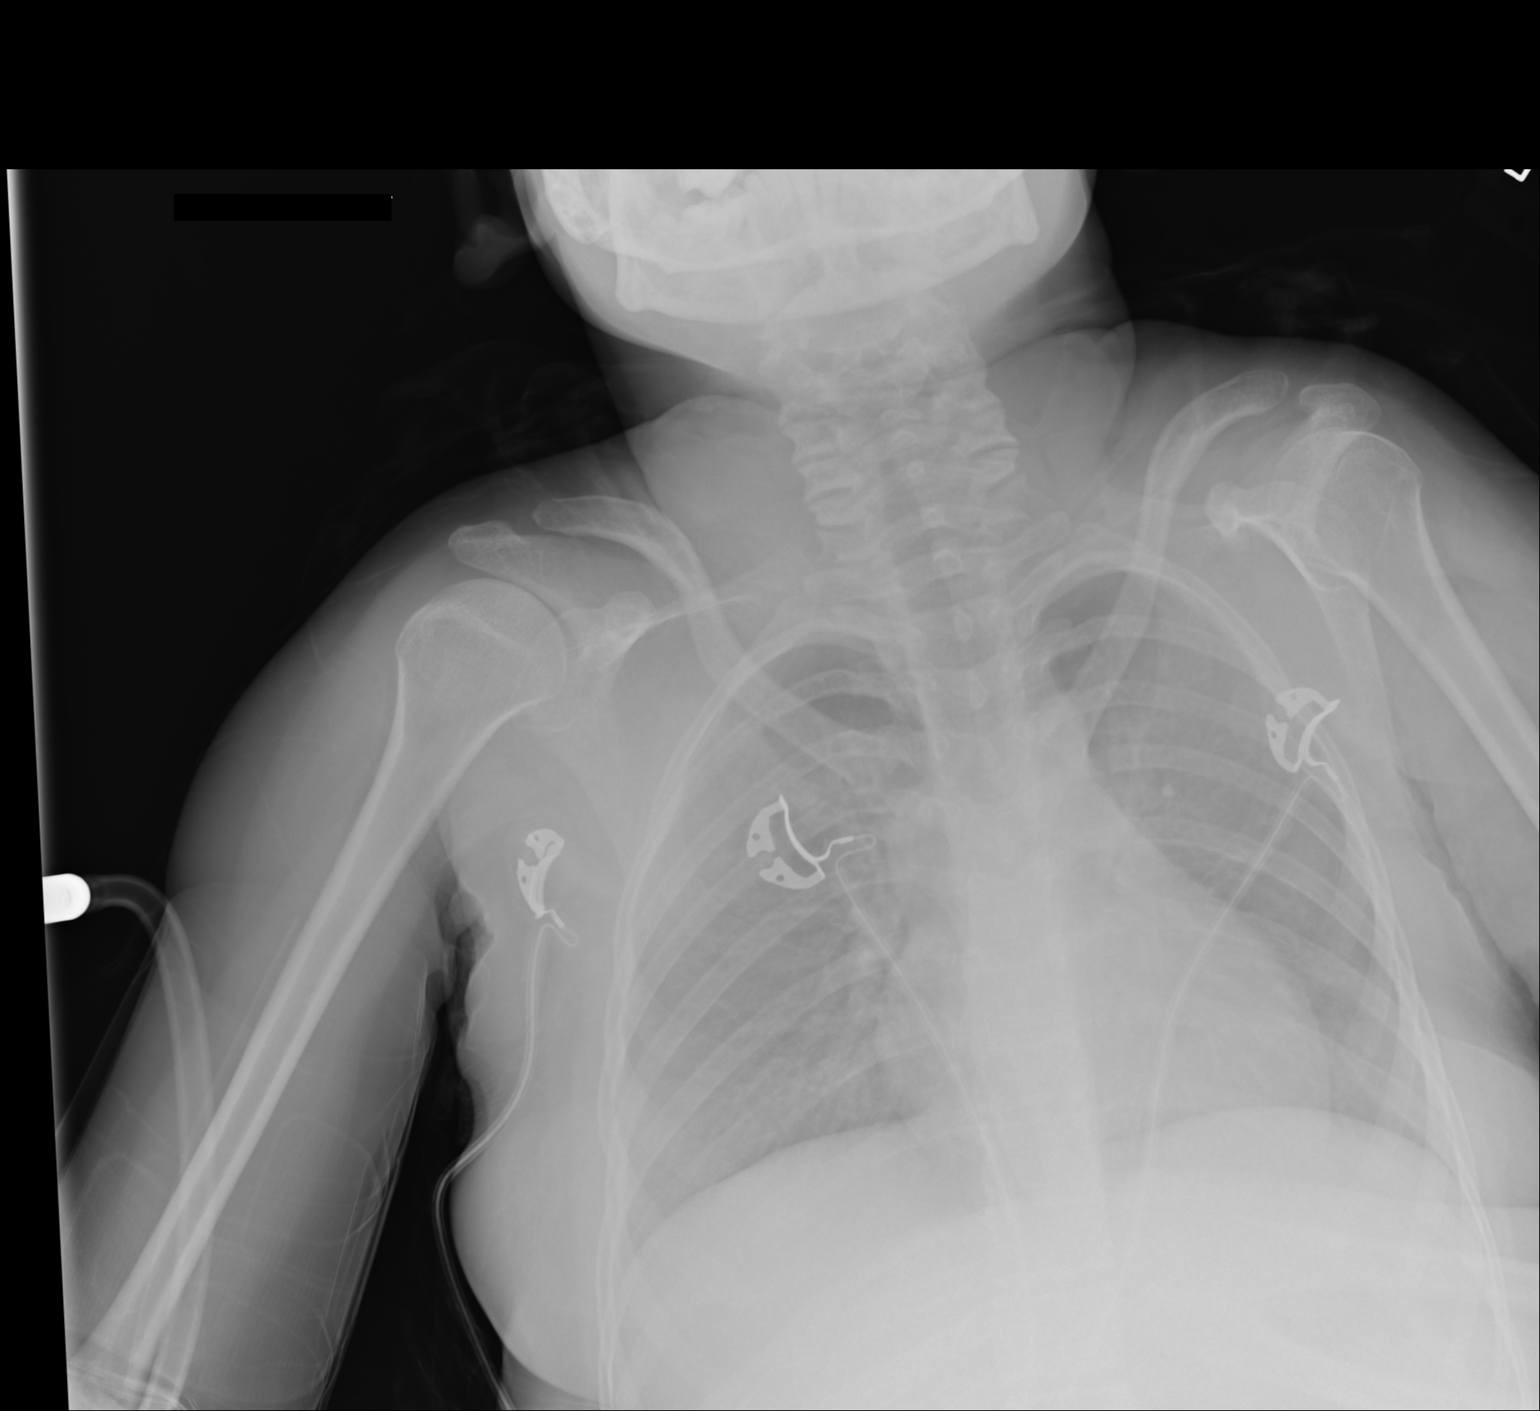

[1 of 1 positions shown; findings below may reference images not displayed]

FINDINGS: The heart size and mediastinal contours are within normal limits.
Both lungs are clear. The visualized skeletal structures are
unremarkable.
IMPRESSION: No active disease.

## 2016-02-24 NOTE — Telephone Encounter (Signed)
We do not have the notes they are looking for, pt will have to make appt. Gma aware

## 2016-02-25 ENCOUNTER — Telehealth: Payer: Self-pay | Admitting: Family Medicine

## 2016-03-09 ENCOUNTER — Encounter (INDEPENDENT_AMBULATORY_CARE_PROVIDER_SITE_OTHER): Payer: Self-pay | Admitting: Pediatrics

## 2016-03-09 ENCOUNTER — Ambulatory Visit (INDEPENDENT_AMBULATORY_CARE_PROVIDER_SITE_OTHER): Payer: Medicaid Other | Admitting: Pediatrics

## 2016-03-09 VITALS — BP 100/70 | HR 88 | Ht <= 58 in | Wt <= 1120 oz

## 2016-03-09 DIAGNOSIS — G4700 Insomnia, unspecified: Secondary | ICD-10-CM

## 2016-03-09 DIAGNOSIS — G40309 Generalized idiopathic epilepsy and epileptic syndromes, not intractable, without status epilepticus: Secondary | ICD-10-CM | POA: Diagnosis not present

## 2016-03-09 DIAGNOSIS — G801 Spastic diplegic cerebral palsy: Secondary | ICD-10-CM

## 2016-03-09 DIAGNOSIS — H47033 Optic nerve hypoplasia, bilateral: Secondary | ICD-10-CM | POA: Diagnosis not present

## 2016-03-09 DIAGNOSIS — F72 Severe intellectual disabilities: Secondary | ICD-10-CM

## 2016-03-09 MED ORDER — AMANTADINE HCL 100 MG PO TABS: ORAL_TABLET | ORAL | 5 refills | Status: DC

## 2016-03-09 MED ORDER — ZONISAMIDE 100 MG PO CAPS
ORAL_CAPSULE | ORAL | 5 refills | Status: DC
Start: 2016-03-09 — End: 2016-09-21

## 2016-03-09 MED ORDER — CLONIDINE HCL ER 0.1 MG PO TB12: ORAL_TABLET | ORAL | 5 refills | Status: DC

## 2016-03-09 MED ORDER — TIZANIDINE HCL 2 MG PO TABS: ORAL_TABLET | ORAL | 5 refills | Status: DC

## 2016-03-09 MED ORDER — HYDROXYZINE HCL 25 MG PO TABS: 25.0000 mg | ORAL_TABLET | Freq: Three times a day (TID) | ORAL | 5 refills | Status: DC | PRN

## 2016-03-09 NOTE — Progress Notes (Signed)
Patient: Abigail Hartman MRN: [id number] Sex: Hartman DOB: 09/01/1995  Provider: Deetta Perla, MD Location of Care: [facility]  Note type: Routine return visit  History of Present Illness: Referral Source: Dr. Rudi Heap History from: mother, patient and [employer] Chief Complaint: Anoxic Brain Damage/Severe Developmental Delay  [facility] who returns on March 17, 2016 for the first time since Jul 30, 2015.  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  She does not have pituitary dysfunction.  She has a history of generalized tonic-clonic seizures.  Her last breakthrough seizure was December 18, 2014.  She has been seizure-free since May.  She takes and tolerates zonisamide without side effects.  She was switched from tablet to liquid amantadine and did not tolerate it.  She takes number of medications for sleep and tizanidine for spasticity.  Her health has been good.  She has had no serious illnesses in the seven months since she was last seen.    She was here today with her [facility] who notes that she has become more aggressive she will pinch and scratch.  This usually happens when she is frustrated because she is unable to speak.  She likes to have her feet rubbed and will sometimes grabbed her feet or touch the person who is next her with her feet.  She has certain behaviors that let her mother know when she has defecated.  She has some problems with insomnia, which is treated with clonidine and hydroxyzine.  Amantadine is given to her for agitation.  She is in the 12th grade at [employer] and will be able to continue there for another two years.  Her level of functioning is low enough, that she will not be gainfully  employed and will be dependent on others for life.  Review of Systems: 12 system review was assessed and was negative  Past Medical History Diagnosis Date  . Blind   . Developmental delay, severe   . Hypothyroidism   . Seizures (HCC)    Hospitalizations: No., Head Injury: No., Nervous System Infections: No., Immunizations up to date: Yes.    Her last seizure occurred on December 18, 2014; prior to that 2011; seizures were generalized tonic-clonic in nature.  She has problems with insomnia and sometimes will be awake for three or four days straight and then sleep.  I reviewed her last evaluation at [facility]. Her last video EEG was poorly organized and showed delta and beta range activity. There were four push button events with no EEG seizure correlates and she had frequent eye movement artifact.  MRI scan of the brain at [facility] showed an absent septum pellucidum and hypoplastic optic nerves consistent with the condition noted septo-optic dysplasia.  In the past, she had numerous hospitalizations for recurrent seizures, pneumonia, and at one point had a problem with aspiration pneumonia and a gastrostomy tube was placed for about one to two years. It was removed about two years ago.  She is followed by [facility] at [facility] for endocrinology and also by pediatric nephrology at [facility]. She has also been followed at [employer] by orthopedic surgery and physical therapy.  Birth History 7 lbs. 14 oz. infant born at [redacted] weeks gestational age to a 20 year old primigravida Gestation  was uncomplicated Normal spontaneous vaginal delivery Nursery Course was complicated by tight nuchal cord without heart rate of breath at birth requiring resuscitation focal seizures followed by cardiac arrest prolonged hospitalization dysphagia and quadriparesis, blindness Growth and Development was recalled as globally delayed  Behavior History none  Surgical  History Procedure Laterality Date  . GI TUBE  2011   Family History family history is not on file. Family history is negative for migraines, seizures, intellectual disabilities, blindness, deafness, birth defects, chromosomal disorder, or autism.  Social History . Marital status: Single    Spouse name: N/A  . Number of children: N/A  . Years of education: N/A   Social History Main Topics  . Smoking status: Passive Smoke Exposure - Never Smoker  . Smokeless tobacco: Never Used  . Alcohol use No  . Drug use: No  . Sexual activity: No   Social History Narrative    [name] is a 20 yo Hartman.    She graduated from [employer].    She lives with both parents.    She enjoys listening to music.   Allergies Allergen Reactions  . Motrin [Ibuprofen] Rash   Physical Exam BP 100/70   Pulse 88   Ht 4' (1.219 m)   Wt 62 lb (28.1 kg)   BMI 18.92 kg/m   General: alert, small stature, well nourished, in no acute distress, brown hair, brown eyes, right handed Head: microocephalic, no dysmorphic features Ears, Nose and Throat: Otoscopic: tympanic membranes normal; pharynx: oropharynx is pink without exudates or tonsillar hypertrophy Neck: supple, full range of motion, no cranial or cervical bruits Respiratory: auscultation clear Cardiovascular: no murmurs, pulses are normal Musculoskeletal: no skeletal deformities or apparent scoliosis Skin: no rashes or neurocutaneous lesions  Neurologic Exam  Mental Status: alert; smiles, does not fix or follow with her eyes Cranial Nerves: visual fields are full to double simultaneous stimuli; extraocular movements are full and conjugate; pupils are round reactive to light; funduscopic examination shows sharp disc margins with normal vessels; symmetric facial strength; midline tongue and uvula; air conduction is greater than bone conduction bilaterally Motor: Normal strength, tone and mass; good fine motor movements; no pronator drift Sensory:  intact responses to cold, vibration, proprioception and stereognosis Coordination: good finger-to-nose, rapid repetitive alternating movements and finger apposition Gait and Station: normal gait and station: patient is able to walk on heels, toes and tandem without difficulty; balance is adequate; Romberg exam is negative; Gower response is negative Reflexes: symmetric and diminished bilaterally; no clonus; bilateral flexor plantar responses  Assessment 1. Epilepsy, generalized, convulsive, G40.309. 2. Insomnia, unspecified type, G47.00. 3. 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 4. Spastic diplegia, G80.1. 5. 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  Discussion [facility] is stable.  I am not certain why she has become more aggressive.  She has no way to express herself and I suspect has a great deal of frustration.  Things are probably a little worse right now because she is not taking amantadine.    Plan I will switch her back to tablets.  I refilled prescriptions today for amantadine, hydroxyzine, tizanidine, zonisamide, and clonidine.  I spent 30 minutes of face-to-face time with [facility] and her [facility].  She will return in six months for routine return visit.  I will see her sooner based on clinical need.   Medication List   Accurate as of 03/09/16 11:18 AM.      acetaminophen 160 MG/5ML solution Commonly known as:  TYLENOL Take 240 mg by mouth every  6 (six) hours as needed for fever.   amantadine 50 MG/5ML solution Commonly known as:  SYMMETREL Give 5mls by mouth twice per day   ciprofloxacin 250 MG tablet Commonly known as:  CIPRO Take 1 tablet (250 mg total) by mouth 2 (two) times daily.   cloNIDine HCl 0.1 MG Tb12 ER tablet Commonly known as:  KAPVAY TAKE ONE TABLET BY MOUTH IN THE MORNING AND ONE IN THE EVENING AND TWO AT BEDTIME   diazepam 10 MG Gel Commonly known as:  DIASTAT ACUDIAL Place 5 mg rectally.   hydrOXYzine 25 MG tablet Commonly known as:   ATARAX/VISTARIL TAKE ONE TABLET BY MOUTH THREE TIMES DAILY AS NEEDED   levothyroxine 50 MCG tablet Commonly known as:  SYNTHROID, LEVOTHROID TAKE ONE TABLET BY MOUTH ONCE DAILY   polyethylene glycol powder powder Commonly known as:  GLYCOLAX/MIRALAX Take 17 g by mouth daily as needed for moderate constipation. For constipation   ranitidine 150 MG tablet Commonly known as:  ZANTAC TAKE ONE TABLET BY MOUTH TWICE DAILY   tiZANidine 2 MG tablet Commonly known as:  ZANAFLEX TAKE 1 TABLET BY MOUTH AT NIGHT TIME   Vitamin D2 2000 units Tabs Take by mouth.   zonisamide 100 MG capsule Commonly known as:  ZONEGRAN TAKE TWO CAPSULES BY MOUTH ONCE DAILY AT BEDTIME     The medication list was reviewed and reconciled. All changes or newly prescribed medications were explained.  A complete medication list was provided to the patient/caregiver.  Deetta PerlaWilliam H Christan Defranco MD

## 2016-03-09 NOTE — Patient Instructions (Signed)
Please sign up for My Chart. 

## 2016-03-10 ENCOUNTER — Telehealth: Payer: Self-pay | Admitting: Family Medicine

## 2016-03-10 NOTE — Telephone Encounter (Signed)
Scheduled

## 2016-03-15 ENCOUNTER — Ambulatory Visit (INDEPENDENT_AMBULATORY_CARE_PROVIDER_SITE_OTHER): Payer: Medicaid Other | Admitting: *Deleted

## 2016-03-15 DIAGNOSIS — Z23 Encounter for immunization: Secondary | ICD-10-CM

## 2016-05-18 ENCOUNTER — Other Ambulatory Visit: Payer: Self-pay | Admitting: Family Medicine

## 2016-05-20 NOTE — Telephone Encounter (Signed)
Last TSH 12/2014

## 2016-06-14 ENCOUNTER — Ambulatory Visit (INDEPENDENT_AMBULATORY_CARE_PROVIDER_SITE_OTHER): Payer: Medicaid Other | Admitting: Family Medicine

## 2016-06-14 ENCOUNTER — Encounter: Payer: Self-pay | Admitting: Family Medicine

## 2016-06-14 VITALS — BP 102/75 | HR 90

## 2016-06-14 DIAGNOSIS — G809 Cerebral palsy, unspecified: Secondary | ICD-10-CM | POA: Diagnosis not present

## 2016-06-14 DIAGNOSIS — H547 Unspecified visual loss: Secondary | ICD-10-CM | POA: Diagnosis not present

## 2016-06-14 DIAGNOSIS — G40309 Generalized idiopathic epilepsy and epileptic syndromes, not intractable, without status epilepticus: Secondary | ICD-10-CM | POA: Diagnosis not present

## 2016-06-14 DIAGNOSIS — G801 Spastic diplegic cerebral palsy: Secondary | ICD-10-CM

## 2016-06-14 DIAGNOSIS — K219 Gastro-esophageal reflux disease without esophagitis: Secondary | ICD-10-CM

## 2016-06-14 NOTE — Progress Notes (Signed)
Subjective:  Patient ID: [facility], female    DOB: 04/05/1995  Age: 21 y.o. MRN: [id number]  CC: Medication Refill (pt here today for medication refills and to get a letter for braces on [facility] feet)   HPI [facility] presents for recheck of cerebtral palsy-related disability. Grandmother is guardian. History from [facility]. Myalgia has been treated over the years for difficulty with spasticity. She is very wobbly on [facility] feet but she can walk. Grandmother says she's never seen [facility] fall but she is wobbly until she gets [facility] balance. The feet are malaligned and turn out. She wears braces that help bring the midline enough for [facility] to ambulate slowly. Otherwise she would be wheelchair bound at all times. Unfortunately the current braces no longer fit well. They have caused bruising of the feet. They are abrading the skin particularly at the medial malleolar region. My cannot express herself but grandmother feels that she does express an element of pain with the old braces. The braces are necessary to support [facility] ankles and feet. They decrease foot eversion and the amount of wobbliness while standing. New braces became necessary because the old ones were too small and too tight. [facility] muscle spasticity is under reasonably good control with the use of current medications including tizanidine.   Grandma says she's not had any apparent seizure for 6 years. She no longer gives [facility] the Valium. It was minimally as when necessary anyway.  Constipation is under good control with when necessary use of MiraLAX.   History [facility] has a past medical history of Blind; Developmental delay, severe; Hypothyroidism; and Seizures (HCC).   She has a past surgical history that includes GI TUBE (2011).   [facility] family history is not on file.She reports that she is a non-smoker but has been exposed to tobacco smoke. She has never used smokeless tobacco. She reports that she does not drink alcohol or use drugs.    ROS Review of  Systems  Unable to perform ROS: Patient nonverbal    Objective:  BP 102/75   Pulse 90   BP Readings from Last 3 Encounters:  06/14/16 102/75  03/09/16 100/70  01/13/16 98/65    Wt Readings from Last 3 Encounters:  03/09/16 62 lb (28.1 kg)  07/30/15 62 lb (28.1 kg) (<1 %, Z < -4.26)*  06/02/15 62 lb (28.1 kg) (<1 %, Z < -4.26)*   * Growth percentiles are based on CDC 2-20 Years data.     Physical Exam  Constitutional: She appears well-nourished. No distress.  Small for age. Wheelchair bound. Safety belted due to lack of coordination. Smiling bright eyed with head swaying from side-to-side constantly rhythmically through the whole visit. No other form of communication.  HENT:  Head: Normocephalic and atraumatic.  Eyes: Conjunctivae and EOM are normal.  Neck: Normal range of motion. Neck supple. No thyromegaly present.  Cardiovascular: Normal rate, regular rhythm and normal heart sounds.   No murmur heard. Pulmonary/Chest: Effort normal and breath sounds normal. No respiratory distress. She has no wheezes. She has no rales.  Abdominal: Soft. Bowel sounds are normal. She exhibits no distension. There is no tenderness.  Musculoskeletal: Normal range of motion.  Lymphadenopathy:    She has no cervical adenopathy.  Neurological: She is alert. She displays abnormal reflex. A cranial nerve deficit is present. She exhibits abnormal muscle tone. Coordination abnormal.  Skin: Skin is warm and dry.  Psychiatric: [facility] affect is labile. Cognition and memory are impaired. She is noncommunicative.  Assessment & Plan:   [facility] was seen today for medication refill.  Diagnoses and all orders for this visit:  Cerebral palsy, unspecified type (HCC)  Gastroesophageal reflux disease without esophagitis  Spastic diplegia (HCC)  Blindness  Epilepsy, generalized, convulsive (HCC)       I have discontinued [facility]'s diazepam and ciprofloxacin. I am also having [facility] maintain  [facility] Vitamin D2, acetaminophen, polyethylene glycol powder, ranitidine, cloNIDine HCl, hydrOXYzine, tiZANidine, zonisamide, Amantadine HCl, and levothyroxine.  Allergies as of 06/14/2016      Reactions   Motrin [ibuprofen] Rash      Medication List       Accurate as of 06/14/16 12:36 PM. Always use your most recent med list.          acetaminophen 160 MG/5ML solution Commonly known as:  TYLENOL Take 240 mg by mouth every 6 (six) hours as needed for fever.   Amantadine HCl 100 MG tablet Take one half tablet twice daily   cloNIDine HCl 0.1 MG Tb12 ER tablet Commonly known as:  KAPVAY TAKE ONE TABLET BY MOUTH IN THE MORNING AND ONE IN THE EVENING AND TWO AT BEDTIME   hydrOXYzine 25 MG tablet Commonly known as:  ATARAX/VISTARIL Take 1 tablet (25 mg total) by mouth 3 (three) times daily as needed.   levothyroxine 50 MCG tablet Commonly known as:  SYNTHROID, LEVOTHROID TAKE ONE TABLET BY MOUTH ONCE DAILY   polyethylene glycol powder powder Commonly known as:  GLYCOLAX/MIRALAX Take 17 g by mouth daily as needed for moderate constipation. For constipation   ranitidine 150 MG tablet Commonly known as:  ZANTAC TAKE ONE TABLET BY MOUTH TWICE DAILY   tiZANidine 2 MG tablet Commonly known as:  ZANAFLEX TAKE 1 TABLET BY MOUTH AT NIGHT TIME   Vitamin D2 2000 units Tabs Take by mouth.   zonisamide 100 MG capsule Commonly known as:  ZONEGRAN TAKE TWO CAPSULES BY MOUTH ONCE DAILY AT BEDTIME      Note to be appended to [facility] CMN.  Meds refilled  Follow-up: Return in about 6 months (around 12/15/2016) for Cerebral palsy.  Mechele ClaudeWarren Saaya Procell, M.D.

## 2016-08-27 ENCOUNTER — Telehealth: Payer: Self-pay | Admitting: *Deleted

## 2016-08-27 ENCOUNTER — Ambulatory Visit (INDEPENDENT_AMBULATORY_CARE_PROVIDER_SITE_OTHER): Payer: Medicaid Other | Admitting: Family Medicine

## 2016-08-27 VITALS — BP 97/71 | HR 100 | Temp 97.1°F

## 2016-08-27 DIAGNOSIS — N921 Excessive and frequent menstruation with irregular cycle: Secondary | ICD-10-CM

## 2016-08-27 DIAGNOSIS — G809 Cerebral palsy, unspecified: Secondary | ICD-10-CM

## 2016-08-27 LAB — WET PREP FOR TRICH, YEAST, CLUE
Clue Cell Exam: NEGATIVE
TRICHOMONAS EXAM: NEGATIVE
YEAST EXAM: NEGATIVE

## 2016-08-27 NOTE — Progress Notes (Addendum)
Subjective:  Patient ID: [facility], female    DOB: 04-15-1995  Age: 21 y.o. MRN: [id number]  CC: Vaginal Discharge (pt here today c/o dark vaginal discharge x 3 days which they notice on her pamper when changing her. She had just finished her menses.)   HPI [facility] presents for Mom states last period was May 16. Lasted 6 days. Was normal. Started bleeding again 3 days ago which would've been May 29. This is unusual for her. Her menses is usually regular. This time it was deep colored even black. Moderate flow. Patient is unable to consent for sex due to her cerebral palsy and lack of cognition. Mom states that there is no reason to believe that she has been sexually abused or sexually active in any way.  History [facility] has a past medical history of Blind; Developmental delay, severe; Hypothyroidism; and Seizures (HCC).   She has a past surgical history that includes GI TUBE (2011).   Her family history is not on file.She reports that she is a non-smoker but has been exposed to tobacco smoke. She has never used smokeless tobacco. She reports that she does not drink alcohol or use drugs.  Current Outpatient Prescriptions on File Prior to Visit  Medication Sig Dispense Refill  . acetaminophen (TYLENOL) 160 MG/5ML solution Take 240 mg by mouth every 6 (six) hours as needed for fever.    . Amantadine HCl 100 MG tablet Take one half tablet twice daily 31 tablet 5  . cloNIDine HCl (KAPVAY) 0.1 MG TB12 ER tablet TAKE ONE TABLET BY MOUTH IN THE MORNING AND ONE IN THE EVENING AND TWO AT BEDTIME 124 tablet 5  . Ergocalciferol (VITAMIN D2) 2000 UNITS TABS Take by mouth.    . hydrOXYzine (ATARAX/VISTARIL) 25 MG tablet Take 1 tablet (25 mg total) by mouth 3 (three) times daily as needed. 90 tablet 5  . levothyroxine (SYNTHROID, LEVOTHROID) 50 MCG tablet TAKE ONE TABLET BY MOUTH ONCE DAILY 90 tablet 0  . polyethylene glycol powder (GLYCOLAX/MIRALAX) powder Take 17 g by mouth daily as needed for  moderate constipation. For constipation 3350 g 5  . ranitidine (ZANTAC) 150 MG tablet TAKE ONE TABLET BY MOUTH TWICE DAILY 60 tablet 0  . tiZANidine (ZANAFLEX) 2 MG tablet TAKE 1 TABLET BY MOUTH AT NIGHT TIME 31 tablet 5  . zonisamide (ZONEGRAN) 100 MG capsule TAKE TWO CAPSULES BY MOUTH ONCE DAILY AT BEDTIME 62 capsule 5   No current facility-administered medications on file prior to visit.     ROS Review of Systems  Unable to perform ROS: Patient nonverbal    Objective:  BP 97/71   Pulse 100   Temp 97.1 F (36.2 C) (Oral)   Physical Exam  Constitutional: She appears well-nourished. No distress.  HENT:  Head: Normocephalic.  Eyes: EOM are normal. Pupils are equal, round, and reactive to light.  Neck: Normal range of motion.  Cardiovascular: Normal heart sounds.   Pulmonary/Chest: Breath sounds normal.  Abdominal: Soft. There is no tenderness.  Genitourinary:  Genitourinary Comments: Pt. Would not tolerate exam  Musculoskeletal:  Waves arms and legs randomly while harnessed in wheelchair.  Neurological:  Pt. Alert, unable to verbalize exccept for gutteral purring     Assessment & Plan:   Chayah was seen today for vaginal discharge.  Diagnoses and all orders for this visit:  Metrorrhagia -     WET PREP FOR TRICH, YEAST, CLUE  Cerebral palsy, unspecified type (HCC)   I am having Ms.  Dohner maintain her Vitamin D2, acetaminophen, polyethylene glycol powder, ranitidine, cloNIDine HCl, hydrOXYzine, tiZANidine, zonisamide, Amantadine HCl, and levothyroxine.  No sign of infection noted on wet prep. Guardian advised to observe for spontaneous resolution of menstrual flow. If not resolved within a few days. Consider complete GYN eval.   Follow-up: Return if symptoms worsen or fail to improve.  Mechele ClaudeWarren Kert Shackett, M.D.  ADDENDUM: Pt. s mother reports pt. Having excessive weakness in legs, can not stand on her own without support. Legs strength 2/5.  BILATERAL LEG BRACES  REQUIRED FOR PT. TO KEEP LEGS STRAIGHT AND HELP WITH MOVEMENT. THIS IS DUE TO DX OF CEREBRAL PALSY.

## 2016-08-27 NOTE — Telephone Encounter (Signed)
Pt's mother aware that wet prep came back negative and the vaginal discharge is more than likely old blood. Pt's mother states she had started her menses after she left the office.

## 2016-09-21 ENCOUNTER — Other Ambulatory Visit (INDEPENDENT_AMBULATORY_CARE_PROVIDER_SITE_OTHER): Payer: Self-pay | Admitting: Pediatrics

## 2016-09-21 DIAGNOSIS — G40309 Generalized idiopathic epilepsy and epileptic syndromes, not intractable, without status epilepticus: Secondary | ICD-10-CM

## 2016-09-21 DIAGNOSIS — G801 Spastic diplegic cerebral palsy: Secondary | ICD-10-CM

## 2016-10-25 ENCOUNTER — Other Ambulatory Visit (INDEPENDENT_AMBULATORY_CARE_PROVIDER_SITE_OTHER): Payer: Self-pay | Admitting: Family

## 2016-10-25 DIAGNOSIS — G4700 Insomnia, unspecified: Secondary | ICD-10-CM

## 2016-10-25 MED ORDER — AMANTADINE HCL 100 MG PO TABS: ORAL_TABLET | ORAL | 0 refills | Status: DC

## 2016-10-26 ENCOUNTER — Other Ambulatory Visit (INDEPENDENT_AMBULATORY_CARE_PROVIDER_SITE_OTHER): Payer: Self-pay | Admitting: Pediatrics

## 2016-10-26 ENCOUNTER — Other Ambulatory Visit: Payer: Self-pay | Admitting: Family Medicine

## 2016-10-26 ENCOUNTER — Ambulatory Visit (INDEPENDENT_AMBULATORY_CARE_PROVIDER_SITE_OTHER): Payer: Medicaid Other | Admitting: Family Medicine

## 2016-10-26 DIAGNOSIS — H5460 Unqualified visual loss, one eye, unspecified: Secondary | ICD-10-CM

## 2016-10-26 DIAGNOSIS — K219 Gastro-esophageal reflux disease without esophagitis: Secondary | ICD-10-CM | POA: Diagnosis not present

## 2016-10-26 DIAGNOSIS — G801 Spastic diplegic cerebral palsy: Secondary | ICD-10-CM

## 2016-10-26 DIAGNOSIS — G40309 Generalized idiopathic epilepsy and epileptic syndromes, not intractable, without status epilepticus: Secondary | ICD-10-CM

## 2016-10-26 DIAGNOSIS — Z993 Dependence on wheelchair: Secondary | ICD-10-CM | POA: Diagnosis not present

## 2016-10-26 DIAGNOSIS — G4700 Insomnia, unspecified: Secondary | ICD-10-CM

## 2016-10-27 NOTE — Telephone Encounter (Signed)
Last seen 08/27/16  Dr Darlyn ReadStacks    Last thyroid level 01/16/15

## 2016-11-08 ENCOUNTER — Ambulatory Visit (INDEPENDENT_AMBULATORY_CARE_PROVIDER_SITE_OTHER): Payer: Medicaid Other | Admitting: Family

## 2016-11-23 ENCOUNTER — Other Ambulatory Visit: Payer: Self-pay | Admitting: Family Medicine

## 2016-11-23 ENCOUNTER — Other Ambulatory Visit: Payer: Self-pay | Admitting: Family

## 2016-11-23 ENCOUNTER — Other Ambulatory Visit (INDEPENDENT_AMBULATORY_CARE_PROVIDER_SITE_OTHER): Payer: Self-pay | Admitting: Pediatrics

## 2016-11-23 DIAGNOSIS — G4700 Insomnia, unspecified: Secondary | ICD-10-CM

## 2016-11-23 DIAGNOSIS — G801 Spastic diplegic cerebral palsy: Secondary | ICD-10-CM

## 2016-11-23 DIAGNOSIS — G40309 Generalized idiopathic epilepsy and epileptic syndromes, not intractable, without status epilepticus: Secondary | ICD-10-CM

## 2016-11-25 ENCOUNTER — Ambulatory Visit (INDEPENDENT_AMBULATORY_CARE_PROVIDER_SITE_OTHER): Payer: Medicaid Other | Admitting: Family

## 2016-12-06 ENCOUNTER — Ambulatory Visit (INDEPENDENT_AMBULATORY_CARE_PROVIDER_SITE_OTHER): Payer: Medicaid Other | Admitting: Family

## 2016-12-25 ENCOUNTER — Other Ambulatory Visit (INDEPENDENT_AMBULATORY_CARE_PROVIDER_SITE_OTHER): Payer: Self-pay | Admitting: Pediatrics

## 2016-12-25 ENCOUNTER — Other Ambulatory Visit: Payer: Self-pay | Admitting: Family Medicine

## 2016-12-25 ENCOUNTER — Other Ambulatory Visit: Payer: Self-pay | Admitting: Family

## 2016-12-25 DIAGNOSIS — G801 Spastic diplegic cerebral palsy: Secondary | ICD-10-CM

## 2016-12-25 DIAGNOSIS — G40309 Generalized idiopathic epilepsy and epileptic syndromes, not intractable, without status epilepticus: Secondary | ICD-10-CM

## 2016-12-25 DIAGNOSIS — G4700 Insomnia, unspecified: Secondary | ICD-10-CM

## 2016-12-27 ENCOUNTER — Encounter (INDEPENDENT_AMBULATORY_CARE_PROVIDER_SITE_OTHER): Payer: Self-pay | Admitting: Family

## 2016-12-27 ENCOUNTER — Ambulatory Visit (INDEPENDENT_AMBULATORY_CARE_PROVIDER_SITE_OTHER): Payer: Medicaid Other | Admitting: Family

## 2016-12-27 DIAGNOSIS — G40309 Generalized idiopathic epilepsy and epileptic syndromes, not intractable, without status epilepticus: Secondary | ICD-10-CM

## 2016-12-27 DIAGNOSIS — G809 Cerebral palsy, unspecified: Secondary | ICD-10-CM

## 2016-12-27 DIAGNOSIS — F72 Severe intellectual disabilities: Secondary | ICD-10-CM | POA: Diagnosis not present

## 2016-12-27 DIAGNOSIS — H547 Unspecified visual loss: Secondary | ICD-10-CM

## 2016-12-27 DIAGNOSIS — G4700 Insomnia, unspecified: Secondary | ICD-10-CM

## 2016-12-27 DIAGNOSIS — G801 Spastic diplegic cerebral palsy: Secondary | ICD-10-CM

## 2016-12-27 MED ORDER — ZONISAMIDE 100 MG PO CAPS: ORAL_CAPSULE | ORAL | 5 refills | Status: DC

## 2016-12-27 MED ORDER — TIZANIDINE HCL 2 MG PO TABS: ORAL_TABLET | ORAL | 5 refills | Status: DC

## 2016-12-27 MED ORDER — AMANTADINE HCL 100 MG PO TABS: ORAL_TABLET | ORAL | 5 refills | Status: DC

## 2016-12-27 MED ORDER — HYDROXYZINE HCL 25 MG PO TABS: 25.0000 mg | ORAL_TABLET | Freq: Three times a day (TID) | ORAL | 5 refills | Status: DC | PRN

## 2016-12-27 MED ORDER — CLONIDINE HCL ER 0.1 MG PO TB12: ORAL_TABLET | ORAL | 5 refills | Status: DC

## 2016-12-27 NOTE — Telephone Encounter (Signed)
Please fill this medication at today's visit at 2pm.

## 2016-12-27 NOTE — Telephone Encounter (Signed)
Please fill these medications at the 2pm appt she has today. Thank you.

## 2016-12-27 NOTE — Patient Instructions (Signed)
Thank you for coming in today.   Instructions for you until your next appointment are as follows: 1. Continue [facility]'s medications as you have been giving them.  2. Continue to give her Melatonin to help her to get to sleep.  3. Call if you have any questions or concerns.  4. Please sign up for MyChart if you have not done so. 5. Please plan to return for follow up in 6 months or sooner if needed.

## 2016-12-27 NOTE — Progress Notes (Signed)
Patient: Abigail Hartman MRN: [id number] Sex: female DOB: June 23, 1995  Provider: Elveria Rising, NP Location of Care: [facility]  Note type: Routine return visit  History of Present Illness: Referral Source: Abigail Heap, MD History from: mother and patient Chief Complaint: Anoxic Brain Damage/Severe Developmental Delay  Abigail Hartman is a 21 y.o. 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 She does not have pituitary dysfunction. She has generalized tonic clonic seizures. Abigail Hartman was last seen by Dr Abigail Hartman on March 09, 2016. She is taking and tolerating Zonisamide for her seizure disorder and has remained seizure free since September 2016. Abigail Hartman also takes Amantadine and Tizandine for spasticity and movement, and hydroxyzine for sleep.Grandmother says that Abigail Hartman only sleeps from 9PM to 2AM, and then generally remains awake, playing in her bed after that. Grandmother has tried various things to get her to sleep longer without success.  Abigail Hartman also takes Clonidine ER for her behavior. She can be fairly aggressive at times, usually when she is frustrated or anxious.   Abigail Hartman has been generally healthy since her last visit and her grandmother who is with her today has no other health concerns for Abigail Hartman today other than previously mentioned.   Review of Systems: Please see the HPI for neurologic and other pertinent review of systems. Otherwise, all other systems were reviewed and were negative.    Past Medical History:  Diagnosis Date  . Blind   . Developmental delay, severe   . Hypothyroidism   . Seizures (HCC)    Hospitalizations: No., Head Injury: No., Nervous System Infections: No., Immunizations up to date: Yes.   Past Medical History Comments: Her last  seizure occurred on December 18, 2014; prior to that 2011; seizures were generalized tonic-clonic in nature.  She has problems with insomnia and sometimes will be awake for three or four days straight and then sleep.  I reviewed her last evaluation at [facility]. Her last video EEG was poorly organized and showed delta and beta range activity. There were four push button events with no EEG seizure correlates and she had frequent eye movement artifact.  MRI scan of the brain at [facility] showed an absent septum pellucidum and hypoplastic optic nerves consistent with the condition noted septo-optic dysplasia.  In the past, she had numerous hospitalizations for recurrent seizures, pneumonia, and at one point had a problem with aspiration pneumonia and a gastrostomy tube was placed for about one to two years. It was removed about two years ago.  She is followed by Abigail Hartman at [facility] for endocrinology and also by pediatric nephrology at [facility]. She has also been followed at [facility] by orthopedic surgery and physical therapy.   Surgical History Past Surgical History:  Procedure Laterality Date  . GI TUBE  2011    Family History family history is not on file. Family History is otherwise negative for migraines, seizures, cognitive impairment, blindness, deafness, birth defects, chromosomal disorder, autism.  Social History Social History   Social History  . Marital status: Single    Spouse name: N/A  . Number of children: N/A  . Years of education: N/A   Social History Main Topics  . Smoking status: Passive Smoke Exposure - Never Smoker  . Smokeless tobacco: Never Used  . Alcohol use No  . Drug use: No  .  Sexual activity: No   Other Topics Concern  . None   Social History Narrative   Abigail Hartman is a 21 yo female.   She graduated from [employer].   She lives with both parents.   She enjoys listening to music.    Allergies Allergies    Allergen Reactions  . Motrin [Ibuprofen] Rash    Physical Exam BP 100/66   Pulse 86   Ht 4' (1.219 m)   Wt 64 lb (29 kg)   BMI 19.53 kg/m  General: Small statured, well nourished, lying in a stroller, in no evident distress, brown hair, brown eyes, right handed Head: Head microcephalic and atraumatic.  Oropharynx benign. No dysmorphic features Neck: Supple with no carotid bruits Cardiovascular: Regular rate and rhythm, no murmurs Respiratory: Breath sounds clear to auscultation Musculoskeletal: No obvious deformities or scoliosis Skin: No rashes or neurocutaneous lesions  Neurologic Exam Mental Status: Awake and fully alert. No expressive language. Unable to follow commands. Smiles at times.  Cranial Nerves: Fundoscopic exam reveals positive red reflex.  Pupils equal and reactive. It is difficult to get her to fix and follow with her eyes. Visual fields appear fairly full. Turns to localize sound in the periphery but not consistently. Face tongue, palate move normally and symmetrically.   Motor: Spastic quadriparesis with preservation of the right hand Sensory: Withdrawal x 4 Coordination: Unable to adequately test due to patient's inability to cooperate Gait and Station: Refuses to stand and bear weight. Grandmother says that she can sometimes do so but that she requires support Reflexes: Diminished and symmetric. Toes downgoing.  Impression 1. Generalized convulsive epilepsy 2. 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 Severe intellectual disability   Recommendations for plan of care The patient's previous [facility] records were reviewed. Abigail Hartman has neither had nor required imaging or lab studies since the last visit. 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 She is taking and tolerating Zonisamide for her seizure disorder and has remained seizure free since September 2016. Abigail Hartman continues to have problems with insomnia and aggressive behavior, and these problems are managed with medications. She will continue on her current treatment plan without change for now. Abigail Hartman will return for follow up in 6 months or sooner if needed. Grandmother agreed with the plans made today.   The medication list was reviewed and reconciled.  No changes were made in the prescribed medications today.  A complete medication list was provided to the patient's grandmother.  Allergies as of 12/27/2016      Reactions   Motrin [ibuprofen] Rash      Medication List       Accurate as of 12/27/16 11:59 PM. Always use your most recent med list.          acetaminophen 160 MG/5ML solution Commonly known as:  TYLENOL Take 240 mg by mouth every 6 (six) hours as needed for fever.   Amantadine HCl 100 MG tablet Take one half tablet twice daily   cloNIDine HCl 0.1 MG Tb12 ER tablet Commonly known as:  KAPVAY TAKE ONE TABLET BY MOUTH IN THE MORNING AND ONE IN THE EVENING AND TWO AT BEDTIME   hydrOXYzine 25 MG tablet Commonly known as:  ATARAX/VISTARIL Take 1  tablet (25 mg total) by mouth 3 (three) times daily as needed.   levothyroxine 50 MCG tablet Commonly known as:  SYNTHROID, LEVOTHROID TAKE 1 TABLET BY MOUTH ONCE DAILY   polyethylene glycol powder powder Commonly known as:  GLYCOLAX/MIRALAX Take 17 g by mouth daily as needed for moderate constipation. For constipation   ranitidine 150 MG tablet Commonly known as:  ZANTAC TAKE ONE TABLET BY MOUTH TWICE DAILY   tiZANidine 2 MG tablet Commonly known as:  ZANAFLEX TAKE 1 TABLET BY MOUTH AT NIGHT TIME   Vitamin D2 2000 units Tabs Take by mouth.   zonisamide 100 MG capsule Commonly known as:   ZONEGRAN TAKE 2 CAPSULES BY MOUTH ONCE DAILY AT BEDTIME       Dr. Sharene Hartman was consulted regarding the patient.   Total time spent with the patient was 20 minutes, of which 50% or more was spent in counseling and coordination of care.   Abigail Rising NP-C

## 2016-12-28 ENCOUNTER — Other Ambulatory Visit: Payer: Self-pay | Admitting: *Deleted

## 2017-02-24 ENCOUNTER — Other Ambulatory Visit: Payer: Self-pay | Admitting: Family Medicine

## 2017-02-25 NOTE — Telephone Encounter (Signed)
Last thyroid 2016

## 2017-05-03 ENCOUNTER — Ambulatory Visit (INDEPENDENT_AMBULATORY_CARE_PROVIDER_SITE_OTHER): Payer: Medicaid Other

## 2017-05-03 DIAGNOSIS — G801 Spastic diplegic cerebral palsy: Secondary | ICD-10-CM | POA: Diagnosis not present

## 2017-05-03 DIAGNOSIS — Z993 Dependence on wheelchair: Secondary | ICD-10-CM

## 2017-05-03 DIAGNOSIS — K219 Gastro-esophageal reflux disease without esophagitis: Secondary | ICD-10-CM | POA: Diagnosis not present

## 2017-05-03 DIAGNOSIS — H5460 Unqualified visual loss, one eye, unspecified: Secondary | ICD-10-CM

## 2017-05-03 DIAGNOSIS — G40309 Generalized idiopathic epilepsy and epileptic syndromes, not intractable, without status epilepticus: Secondary | ICD-10-CM

## 2017-05-13 ENCOUNTER — Other Ambulatory Visit: Payer: Self-pay | Admitting: Family Medicine

## 2017-05-16 NOTE — Telephone Encounter (Signed)
Last thyroid 01/14/15

## 2017-06-23 ENCOUNTER — Other Ambulatory Visit (INDEPENDENT_AMBULATORY_CARE_PROVIDER_SITE_OTHER): Payer: Self-pay | Admitting: Family

## 2017-06-23 DIAGNOSIS — G4700 Insomnia, unspecified: Secondary | ICD-10-CM

## 2017-06-23 DIAGNOSIS — G801 Spastic diplegic cerebral palsy: Secondary | ICD-10-CM

## 2017-06-23 DIAGNOSIS — G40309 Generalized idiopathic epilepsy and epileptic syndromes, not intractable, without status epilepticus: Secondary | ICD-10-CM

## 2017-07-08 ENCOUNTER — Ambulatory Visit (INDEPENDENT_AMBULATORY_CARE_PROVIDER_SITE_OTHER): Payer: Medicaid Other

## 2017-07-08 DIAGNOSIS — G40309 Generalized idiopathic epilepsy and epileptic syndromes, not intractable, without status epilepticus: Secondary | ICD-10-CM | POA: Diagnosis not present

## 2017-07-08 DIAGNOSIS — H5460 Unqualified visual loss, one eye, unspecified: Secondary | ICD-10-CM | POA: Diagnosis not present

## 2017-07-08 DIAGNOSIS — K219 Gastro-esophageal reflux disease without esophagitis: Secondary | ICD-10-CM | POA: Diagnosis not present

## 2017-07-08 DIAGNOSIS — G801 Spastic diplegic cerebral palsy: Secondary | ICD-10-CM | POA: Diagnosis not present

## 2017-07-23 ENCOUNTER — Other Ambulatory Visit (INDEPENDENT_AMBULATORY_CARE_PROVIDER_SITE_OTHER): Payer: Self-pay | Admitting: Family

## 2017-07-23 DIAGNOSIS — G4700 Insomnia, unspecified: Secondary | ICD-10-CM

## 2017-08-20 ENCOUNTER — Other Ambulatory Visit: Payer: Self-pay | Admitting: Family Medicine

## 2017-08-23 NOTE — Telephone Encounter (Signed)
Last thyroid 01/16/15 

## 2017-09-21 ENCOUNTER — Other Ambulatory Visit (INDEPENDENT_AMBULATORY_CARE_PROVIDER_SITE_OTHER): Payer: Self-pay | Admitting: Family

## 2017-09-21 DIAGNOSIS — F72 Severe intellectual disabilities: Secondary | ICD-10-CM

## 2017-09-21 DIAGNOSIS — G4700 Insomnia, unspecified: Secondary | ICD-10-CM

## 2017-11-20 ENCOUNTER — Other Ambulatory Visit: Payer: Self-pay | Admitting: Family Medicine

## 2017-11-21 NOTE — Telephone Encounter (Signed)
Last thyroid 01/16/15

## 2017-12-24 ENCOUNTER — Other Ambulatory Visit (INDEPENDENT_AMBULATORY_CARE_PROVIDER_SITE_OTHER): Payer: Self-pay | Admitting: Family

## 2017-12-24 DIAGNOSIS — G801 Spastic diplegic cerebral palsy: Secondary | ICD-10-CM

## 2017-12-24 DIAGNOSIS — G4700 Insomnia, unspecified: Secondary | ICD-10-CM

## 2017-12-24 DIAGNOSIS — G40309 Generalized idiopathic epilepsy and epileptic syndromes, not intractable, without status epilepticus: Secondary | ICD-10-CM

## 2018-01-21 ENCOUNTER — Other Ambulatory Visit: Payer: Self-pay | Admitting: Family Medicine

## 2018-01-21 ENCOUNTER — Other Ambulatory Visit (INDEPENDENT_AMBULATORY_CARE_PROVIDER_SITE_OTHER): Payer: Self-pay | Admitting: Family

## 2018-01-21 DIAGNOSIS — G801 Spastic diplegic cerebral palsy: Secondary | ICD-10-CM

## 2018-01-21 DIAGNOSIS — G4700 Insomnia, unspecified: Secondary | ICD-10-CM

## 2018-01-21 DIAGNOSIS — G40309 Generalized idiopathic epilepsy and epileptic syndromes, not intractable, without status epilepticus: Secondary | ICD-10-CM

## 2018-01-23 NOTE — Telephone Encounter (Signed)
Last thyroid 01/16/15 

## 2018-02-25 ENCOUNTER — Other Ambulatory Visit (INDEPENDENT_AMBULATORY_CARE_PROVIDER_SITE_OTHER): Payer: Self-pay | Admitting: Family

## 2018-02-25 DIAGNOSIS — G801 Spastic diplegic cerebral palsy: Secondary | ICD-10-CM

## 2018-02-25 DIAGNOSIS — G4700 Insomnia, unspecified: Secondary | ICD-10-CM

## 2018-02-25 DIAGNOSIS — G40309 Generalized idiopathic epilepsy and epileptic syndromes, not intractable, without status epilepticus: Secondary | ICD-10-CM

## 2018-03-03 ENCOUNTER — Telehealth: Payer: Self-pay | Admitting: Family Medicine

## 2018-03-03 ENCOUNTER — Other Ambulatory Visit: Payer: Self-pay | Admitting: Family Medicine

## 2018-03-03 MED ORDER — FAMOTIDINE 20 MG PO TABS: 20.0000 mg | ORAL_TABLET | Freq: Two times a day (BID) | ORAL | 5 refills | Status: DC

## 2018-03-03 NOTE — Telephone Encounter (Signed)
Pt aware.

## 2018-03-03 NOTE — Telephone Encounter (Signed)
I sent in the requested prescription 

## 2018-03-28 ENCOUNTER — Other Ambulatory Visit (INDEPENDENT_AMBULATORY_CARE_PROVIDER_SITE_OTHER): Payer: Self-pay | Admitting: Family

## 2018-03-28 DIAGNOSIS — G4700 Insomnia, unspecified: Secondary | ICD-10-CM

## 2018-03-28 DIAGNOSIS — G801 Spastic diplegic cerebral palsy: Secondary | ICD-10-CM

## 2018-03-28 DIAGNOSIS — G40309 Generalized idiopathic epilepsy and epileptic syndromes, not intractable, without status epilepticus: Secondary | ICD-10-CM

## 2018-04-26 ENCOUNTER — Other Ambulatory Visit (INDEPENDENT_AMBULATORY_CARE_PROVIDER_SITE_OTHER): Payer: Self-pay | Admitting: Family

## 2018-04-26 DIAGNOSIS — G4700 Insomnia, unspecified: Secondary | ICD-10-CM

## 2018-04-26 DIAGNOSIS — G801 Spastic diplegic cerebral palsy: Secondary | ICD-10-CM

## 2018-04-26 DIAGNOSIS — G40309 Generalized idiopathic epilepsy and epileptic syndromes, not intractable, without status epilepticus: Secondary | ICD-10-CM

## 2018-05-19 ENCOUNTER — Other Ambulatory Visit (INDEPENDENT_AMBULATORY_CARE_PROVIDER_SITE_OTHER): Payer: Self-pay | Admitting: Family

## 2018-05-19 DIAGNOSIS — G801 Spastic diplegic cerebral palsy: Secondary | ICD-10-CM

## 2018-05-22 ENCOUNTER — Telehealth (INDEPENDENT_AMBULATORY_CARE_PROVIDER_SITE_OTHER): Payer: Self-pay | Admitting: Family

## 2018-05-22 ENCOUNTER — Other Ambulatory Visit (INDEPENDENT_AMBULATORY_CARE_PROVIDER_SITE_OTHER): Payer: Self-pay | Admitting: Family

## 2018-05-22 DIAGNOSIS — G4700 Insomnia, unspecified: Secondary | ICD-10-CM

## 2018-05-22 DIAGNOSIS — G40309 Generalized idiopathic epilepsy and epileptic syndromes, not intractable, without status epilepticus: Secondary | ICD-10-CM

## 2018-05-22 DIAGNOSIS — F72 Severe intellectual disabilities: Secondary | ICD-10-CM

## 2018-05-22 MED ORDER — HYDROXYZINE HCL 25 MG PO TABS: 25.0000 mg | ORAL_TABLET | Freq: Three times a day (TID) | ORAL | 5 refills | Status: DC | PRN

## 2018-05-22 MED ORDER — ZONISAMIDE 100 MG PO CAPS: 200.0000 mg | ORAL_CAPSULE | Freq: Every day | ORAL | 0 refills | Status: DC

## 2018-05-22 MED ORDER — AMANTADINE HCL 100 MG PO TABS: ORAL_TABLET | ORAL | 0 refills | Status: DC

## 2018-05-22 MED ORDER — FAMOTIDINE 20 MG PO TABS: 20.0000 mg | ORAL_TABLET | Freq: Two times a day (BID) | ORAL | 5 refills | Status: DC

## 2018-05-22 MED ORDER — CLONIDINE HCL ER 0.1 MG PO TB12: ORAL_TABLET | ORAL | 5 refills | Status: DC

## 2018-05-22 NOTE — Telephone Encounter (Signed)
°  Who's calling (name and relationship to patient) :  Abigail Hartman- Mom   Best contact number: [phone]  Provider they see: Elveria Rising    Reason for call:  Mom called to inform Abigail Hartman that Abigail Hartman will not be able to make a follow up appointment for the next month or two. Her father was ran over by a tractor and is unable to walk well so he must be attended to at all times. Mom would like to make sure Abigail Hartman can still get her refills for all of her medication. She is almost out as well. Please advise.    PRESCRIPTION REFILL ONLY  Name of prescription: Amantadine 100MG   Zonisamide 100 MG  Clonidine .1 MG (almost completely out)  Famotidine 20MG   Tizanidine 2 MG   Pharmacy:  [facility] - [id number] [place]

## 2018-05-22 NOTE — Telephone Encounter (Signed)
Refills have been sent to the pharmacy as requested 

## 2018-06-12 ENCOUNTER — Other Ambulatory Visit (INDEPENDENT_AMBULATORY_CARE_PROVIDER_SITE_OTHER): Payer: Self-pay | Admitting: Pediatrics

## 2018-06-12 ENCOUNTER — Other Ambulatory Visit: Payer: Self-pay | Admitting: Family Medicine

## 2018-06-12 DIAGNOSIS — G801 Spastic diplegic cerebral palsy: Secondary | ICD-10-CM

## 2018-06-24 ENCOUNTER — Other Ambulatory Visit (INDEPENDENT_AMBULATORY_CARE_PROVIDER_SITE_OTHER): Payer: Self-pay | Admitting: Family

## 2018-06-24 DIAGNOSIS — G4700 Insomnia, unspecified: Secondary | ICD-10-CM

## 2018-06-24 DIAGNOSIS — G40309 Generalized idiopathic epilepsy and epileptic syndromes, not intractable, without status epilepticus: Secondary | ICD-10-CM

## 2018-07-15 ENCOUNTER — Other Ambulatory Visit (INDEPENDENT_AMBULATORY_CARE_PROVIDER_SITE_OTHER): Payer: Self-pay | Admitting: Family

## 2018-07-15 DIAGNOSIS — G4700 Insomnia, unspecified: Secondary | ICD-10-CM

## 2018-07-15 DIAGNOSIS — G801 Spastic diplegic cerebral palsy: Secondary | ICD-10-CM

## 2018-07-15 DIAGNOSIS — G40309 Generalized idiopathic epilepsy and epileptic syndromes, not intractable, without status epilepticus: Secondary | ICD-10-CM

## 2018-07-17 ENCOUNTER — Other Ambulatory Visit (INDEPENDENT_AMBULATORY_CARE_PROVIDER_SITE_OTHER): Payer: Self-pay | Admitting: Family

## 2018-07-17 DIAGNOSIS — G801 Spastic diplegic cerebral palsy: Secondary | ICD-10-CM

## 2018-07-17 DIAGNOSIS — G40309 Generalized idiopathic epilepsy and epileptic syndromes, not intractable, without status epilepticus: Secondary | ICD-10-CM

## 2018-07-17 DIAGNOSIS — G4700 Insomnia, unspecified: Secondary | ICD-10-CM

## 2018-07-18 ENCOUNTER — Other Ambulatory Visit: Payer: Self-pay

## 2018-07-18 ENCOUNTER — Encounter (INDEPENDENT_AMBULATORY_CARE_PROVIDER_SITE_OTHER): Payer: Self-pay | Admitting: Family

## 2018-07-18 ENCOUNTER — Ambulatory Visit (INDEPENDENT_AMBULATORY_CARE_PROVIDER_SITE_OTHER): Payer: Medicaid Other | Admitting: Family

## 2018-07-18 DIAGNOSIS — G40309 Generalized idiopathic epilepsy and epileptic syndromes, not intractable, without status epilepticus: Secondary | ICD-10-CM | POA: Diagnosis not present

## 2018-07-18 DIAGNOSIS — G801 Spastic diplegic cerebral palsy: Secondary | ICD-10-CM | POA: Diagnosis not present

## 2018-07-18 DIAGNOSIS — K219 Gastro-esophageal reflux disease without esophagitis: Secondary | ICD-10-CM

## 2018-07-18 DIAGNOSIS — F72 Severe intellectual disabilities: Secondary | ICD-10-CM

## 2018-07-18 DIAGNOSIS — G4700 Insomnia, unspecified: Secondary | ICD-10-CM | POA: Diagnosis not present

## 2018-07-18 DIAGNOSIS — G809 Cerebral palsy, unspecified: Secondary | ICD-10-CM

## 2018-07-18 MED ORDER — TIZANIDINE HCL 2 MG PO TABS: 2.0000 mg | ORAL_TABLET | Freq: Every day | ORAL | 5 refills | Status: DC

## 2018-07-18 MED ORDER — FAMOTIDINE 20 MG PO TABS: 20.0000 mg | ORAL_TABLET | Freq: Two times a day (BID) | ORAL | 5 refills | Status: DC

## 2018-07-18 MED ORDER — DIAZEPAM 1 MG/ML PO SOLN: ORAL | 5 refills | Status: DC

## 2018-07-18 MED ORDER — AMANTADINE HCL 100 MG PO TABS: ORAL_TABLET | ORAL | 5 refills | Status: DC

## 2018-07-18 MED ORDER — HYDROXYZINE HCL 25 MG PO TABS: 25.0000 mg | ORAL_TABLET | Freq: Three times a day (TID) | ORAL | 5 refills | Status: DC | PRN

## 2018-07-18 MED ORDER — ZONISAMIDE 100 MG PO CAPS: ORAL_CAPSULE | ORAL | 5 refills | Status: DC

## 2018-07-18 MED ORDER — CLONIDINE HCL ER 0.1 MG PO TB12: ORAL_TABLET | ORAL | 5 refills | Status: DC

## 2018-07-18 NOTE — Patient Instructions (Signed)
Thank you for talking with me by phone today.   Instructions for you until your next appointment are as follows: 1. I sent in a prescription for Diazepam to see if that will help Abigail Hartman to sleep longer at night. Give her 49ml at bedtime. If she goes to sleep but does not sleep all night, try giving the dose (54ml) when she wakes up to see if that will get her back to sleep for the remainder of the night 2. Continue giving her other medications as you have been doing 3. Let me know where to send the Boost Plus prescription 4. Please sign up for MyChart if you have not done so 5. Please plan to return for follow up in one year or sooner if needed.

## 2018-07-18 NOTE — Progress Notes (Signed)
This is a Pediatric Specialist E-Visit follow up consult provided via Telephone Jessiah Penne LashLeggett and their parent/guardian Marolyn HallerKaren Matherly  consented to an E-Visit consult today.  Location of patient: Myrissa is at home Location of provider: Elveria Risingina Rafaelita Foister, NP is in office Patient was referred by Mechele ClaudeStacks, Warren, MD   The following participants were involved in this E-Visit: mom, CMA, provider  Chief Complain/ Reason for E-Visit today: not sleeping, aggressive behavior Total time on call: 24 min Follow up: 1 year     Arnaldo NatalMyah Mraz   MRN:  [id number]  1995-05-18   Provider: Elveria Risingina Abrina Petz NP-C Location of Care: [facility]  Visit type: Routine visit  Last visit: 12/27/2016  Referral source: Rudi Heaponald Moore, MD History from: mother and [facility] chart  Brief history:  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 She does not have pituitary dysfunction. She has hypothyroidism that is managed by her PCP. She has generalized tonic-clonic seizures. She is taking and tolerating Zonisamide for her seizure disorder and has remained seizure free since September 2016. She takes Amantadine and Tizanidine for spasticity and movement, Clonidine ER for behavior and Hydroxyzine for sleep. She can be aggressive at times, pinching or pulling hair, particularly when she is frustrated or anxious. She is in the care of her grandparents.  Today's concerns:  Grandmother reports today that Pamella has a fairly good appetite but that she has some problems with chewing food. She does not choke on foods or liquids. Grandmother gives her Boost Plus and Flintstones vitamins to help with nutrition. She used to have problems with constipation but Mom says that has improved with increasing her fruit intake.  Grandmother reports that Emila has long history of poor sleep and that she usually awakens around 2AM each morning, then stays awake until around 9AM. Grandmother is fairly exhausted at this time because she has to be awake when Verlee is awake, and she is also caring for her husband who was run over by a tractor and is now at home recovering from that. Jaylena has a CNA that comes in to the home 7 hrs per day, 5 days per week to provide care for her. Mom said that she used to have more hours when she was younger but that the hours were cut when she turned 23 years old.   Review of systems: Please see HPI for neurologic and other pertinent review of systems. Otherwise all other systems were reviewed and were negative.  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   Insomnia 12/03/2014   Cerebral palsy (HCC) 05/07/2014   Hypoxic ischemic encephalopathy, neonatal onset, severe 04/26/2014   Optic nerve hypoplasia of both eyes 04/26/2014   Blindness 04/26/2014   Severe intellectual disability 04/26/2014   GERD (gastroesophageal reflux disease) 07/18/2012   Hypothyroidism 07/18/2012     Past Medical History:  Diagnosis Date   Blind    Developmental delay, severe    Hypothyroidism    Seizures (HCC)     Past medical history comments: See HPI Copied from previous record: Her last seizure occurred on December 18, 2014; prior to that 2011; seizures were generalized tonic-clonic in nature.  She has problems with insomnia and sometimes will be awake for three or four days  straight and then sleep.  I reviewed her last evaluation at [facility]. Her last video EEG was poorly organized and showed delta and beta range activity. There were four push button events with no EEG seizure correlates and she had frequent eye movement artifact.  MRI scan of the brain at [facility]  showed an absent septum pellucidum and hypoplastic optic nerves consistent with the condition noted septo-optic dysplasia.  In the past, she had numerous hospitalizations for recurrent seizures, pneumonia, and at one point had a problem with aspiration pneumonia and a gastrostomy tube was placed for about one to two years. It was removed about two years ago.  She is followed by Joni Fears at [facility] for endocrinology and also by pediatric nephrology at [facility]. She has also been followed at [facility] by orthopedic surgery and physical therapy.  Surgical history: Past Surgical History:  Procedure Laterality Date   GI TUBE  2011     Family history: family history is not on file.   Social history: Social History   Socioeconomic History   Marital status: Single    Spouse name: Not on file   Number of children: Not on file   Years of education: Not on file   Highest education level: Not on file  Occupational History   Not on file  Social Needs   Financial resource strain: Not on file   Food insecurity:    Worry: Not on file    Inability: Not on file   Transportation needs:    Medical: Not on file    Non-medical: Not on file  Tobacco Use   Smoking status: Passive Smoke Exposure - Never Smoker   Smokeless tobacco: Never Used  Substance and Sexual Activity   Alcohol use: No    Alcohol/week: 0.0 standard drinks   Drug use: No   Sexual activity: Never  Lifestyle   Physical activity:    Days per week: Not on file    Minutes per session: Not on file   Stress: Not on file  Relationships   Social connections:    Talks on phone: Not on file    Gets together: Not on file    Attends religious service: Not on file    Active member of club or organization: Not on file    Attends meetings of clubs or organizations: Not on file    Relationship status: Not on file   Intimate partner violence:    Fear of current or ex partner: Not on file     Emotionally abused: Not on file    Physically abused: Not on file    Forced sexual activity: Not on file  Other Topics Concern   Not on file  Social History Narrative   Heela is a 23 yo female.   She graduated from [employer].   She lives with both parents.   She enjoys listening to music.     Past/failed meds:   Allergies: Allergies  Allergen Reactions   Motrin [Ibuprofen] Rash      Immunizations: Immunization History  Administered Date(s) Administered   Influenza,inj,Quad PF,6+ Mos 03/06/2014, 01/14/2015, 03/15/2016     Impression: 1. Generalzied convulsive epilepsy 2. 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  Severe intellectual disability 6. Intermittent aggressive behavior   Recommendations for plan of care: The patient's previous [facility] records were reviewed. Mikiyah has neither had nor required imaging or lab studies since the  last visit. 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 She is taking and tolerating Zonisamide and has remained seizure free since September 2016. I talked with grandmother about Tanija's insomnia and recommended that we add Diazepam to her regimen. I asked Mom to let me know if that helped with Tylan's sleep. Grandmother asked if I would order the Boost Plus and I will do so if grandmother will provide the agency for me to send the order. I will otherwise see her back in follow up in 1 year or sooner if needed. Grandmother agreed with the plans made today.   The medication list was reviewed and reconciled. I reviewed changes that were made in the prescribed medications today. A complete medication list was provided to the patient.  Allergies as of  07/18/2018      Reactions   Motrin [ibuprofen] Rash      Medication List       Accurate as of July 18, 2018 11:59 PM. Always use your most recent med list.        acetaminophen 160 MG/5ML solution Commonly known as:  TYLENOL Take 240 mg by mouth every 6 (six) hours as needed for fever.   Amantadine HCl 100 MG tablet Take 1/2 (one-half) tablet by mouth twice daily   cloNIDine HCl 0.1 MG Tb12 ER tablet Commonly known as:  KAPVAY TAKE 1 TABLET BY MOUTH IN THE MORNING AND 1 IN THE EVENING AND 2 AT BEDTIME   diazepam 2 MG tablet Commonly known as:  VALIUM Give 1 tablet at bedtime   famotidine 20 MG tablet Commonly known as:  Pepcid Take 1 tablet (20 mg total) by mouth 2 (two) times daily.   hydrOXYzine 25 MG tablet Commonly known as:  ATARAX/VISTARIL Take 1 tablet (25 mg total) by mouth 3 (three) times daily as needed.   levothyroxine 50 MCG tablet Commonly known as:  SYNTHROID Take 1 tablet by mouth once daily   polyethylene glycol powder 17 GM/SCOOP powder Commonly known as:  GLYCOLAX/MIRALAX Take 17 g by mouth daily as needed for moderate constipation. For constipation   tiZANidine 2 MG tablet Commonly known as:  ZANAFLEX Take 1 tablet (2 mg total) by mouth at bedtime. TAKE 1 TABLET BY MOUTH AT NIGHT   Vitamin D2 50 MCG (2000 UT) Tabs Take by mouth.   zonisamide 100 MG capsule Commonly known as:  ZONEGRAN TAKE 2 CAPSULES BY MOUTH AT BEDTIME       I consulted with Dr Artis Flock regarding this patient.  Total time spent on the phone with the patient's grandmother was 24 minutes, of which 50% or more was spent in counseling and coordination of care.   Elveria Rising NP-C [facility] Ph. [phone] Fax [phone]

## 2018-07-19 ENCOUNTER — Telehealth (INDEPENDENT_AMBULATORY_CARE_PROVIDER_SITE_OTHER): Payer: Self-pay | Admitting: Family

## 2018-07-19 NOTE — Telephone Encounter (Signed)
°  Who's calling (name and relationship to patient) : Matherly,Karen Best contact number: [phone] Provider they see: [facility]  Reason for call: Please send pill form of Diazepam, Selena will not take the liquid.    PRESCRIPTION REFILL ONLY  Name of prescription:  Pharmacy:

## 2018-07-20 MED ORDER — DIAZEPAM 2 MG PO TABS: ORAL_TABLET | ORAL | 0 refills | Status: DC

## 2018-07-20 NOTE — Telephone Encounter (Signed)
I changed the Rx to Diazepam tablet. TG

## 2018-07-21 ENCOUNTER — Encounter (INDEPENDENT_AMBULATORY_CARE_PROVIDER_SITE_OTHER): Payer: Self-pay | Admitting: Family

## 2018-08-22 ENCOUNTER — Other Ambulatory Visit (INDEPENDENT_AMBULATORY_CARE_PROVIDER_SITE_OTHER): Payer: Self-pay | Admitting: Family

## 2018-08-22 DIAGNOSIS — G4700 Insomnia, unspecified: Secondary | ICD-10-CM

## 2018-08-22 DIAGNOSIS — F72 Severe intellectual disabilities: Secondary | ICD-10-CM

## 2018-08-22 MED ORDER — DIAZEPAM 2 MG PO TABS: ORAL_TABLET | ORAL | 3 refills | Status: DC

## 2018-10-20 ENCOUNTER — Other Ambulatory Visit: Payer: Self-pay | Admitting: Family Medicine

## 2018-10-20 MED ORDER — LEVOTHYROXINE SODIUM 50 MCG PO TABS: 50.0000 ug | ORAL_TABLET | Freq: Every day | ORAL | 0 refills | Status: DC

## 2018-10-20 NOTE — Addendum Note (Signed)
Addended by: Thana Ates on: 10/20/2018 10:48 AM   Modules accepted: Orders

## 2018-10-20 NOTE — Telephone Encounter (Signed)
Stacks. NTBS Last TSH 2016. LOV 2018 Maybe schedule virtual visit

## 2018-10-20 NOTE — Telephone Encounter (Signed)
Mother aware and appointment scheduled.

## 2018-10-31 DIAGNOSIS — Z0279 Encounter for issue of other medical certificate: Secondary | ICD-10-CM

## 2018-11-14 ENCOUNTER — Other Ambulatory Visit: Payer: Self-pay

## 2018-11-15 ENCOUNTER — Encounter: Payer: Self-pay | Admitting: Family Medicine

## 2018-11-15 ENCOUNTER — Ambulatory Visit (INDEPENDENT_AMBULATORY_CARE_PROVIDER_SITE_OTHER): Payer: Medicaid Other | Admitting: Family Medicine

## 2018-11-15 VITALS — BP 98/66 | HR 68 | Temp 97.1°F

## 2018-11-15 DIAGNOSIS — G809 Cerebral palsy, unspecified: Secondary | ICD-10-CM | POA: Diagnosis not present

## 2018-11-15 DIAGNOSIS — Z0001 Encounter for general adult medical examination with abnormal findings: Secondary | ICD-10-CM

## 2018-11-15 DIAGNOSIS — Z0000 Encounter for general adult medical examination without abnormal findings: Secondary | ICD-10-CM

## 2018-11-15 MED ORDER — FLUCONAZOLE 150 MG PO TABS: 150.0000 mg | ORAL_TABLET | Freq: Once | ORAL | 0 refills | Status: AC

## 2018-11-15 MED ORDER — FLUOCINONIDE-E 0.05 % EX CREA: 1.0000 "application " | TOPICAL_CREAM | Freq: Two times a day (BID) | CUTANEOUS | 5 refills | Status: DC

## 2018-11-15 NOTE — Progress Notes (Signed)
Subjective:  Patient ID: Abigail Hartman, female    DOB: 01-Oct-1995  Age: 23 y.o. MRN: [id number]  CC: clearance for dental procedure   HPI Abigail Hartman presents for physical exam. Mom concerned that she has eczema. Vaginal DC is creamy for several days. No distress related. But periods have been irregular.  Depression screen PHQ 2/9 11/15/2018  Decreased Interest 0  Down, Depressed, Hopeless 0  PHQ - 2 Score 0    History Abigail Hartman has a past medical history of Blind, Developmental delay, severe, Hypothyroidism, and Seizures ([place]).   She has a past surgical history that includes GI TUBE (2011).   Her family history is not on file.She reports that she is a non-smoker but has been exposed to tobacco smoke. She has never used smokeless tobacco. She reports that she does not drink alcohol or use drugs.    ROS Review of Systems  Unable to perform ROS: Patient nonverbal    Objective:  BP 98/66   Pulse 68   Temp (!) 97.1 F (36.2 C) (Oral)   BP Readings from Last 3 Encounters:  11/15/18 98/66  12/27/16 100/66  08/27/16 97/71    Wt Readings from Last 3 Encounters:  12/27/16 64 lb (29 kg)  03/09/16 62 lb (28.1 kg)  07/30/15 62 lb (28.1 kg) (<1 %, Z= -7.97)*   * Growth percentiles are based on CDC (Girls, 2-20 Years) data.     Physical Exam Constitutional:      General: She is not in acute distress.    Appearance: She is well-developed.  HENT:     Head: Normocephalic and atraumatic.     Right Ear: External ear normal.     Left Ear: External ear normal.     Nose: Nose normal.  Eyes:     Conjunctiva/sclera: Conjunctivae normal.     Pupils: Pupils are equal, round, and reactive to light.  Neck:     Musculoskeletal: Normal range of motion and neck supple.     Thyroid: No thyromegaly.  Cardiovascular:     Rate and Rhythm: Normal rate and regular rhythm.     Heart sounds: Normal heart sounds. No murmur.  Pulmonary:     Effort: Pulmonary effort is normal. No respiratory  distress.     Breath sounds: Normal breath sounds. No wheezing or rales.  Abdominal:     General: Bowel sounds are normal. There is no distension.     Palpations: Abdomen is soft.     Tenderness: There is no abdominal tenderness.  Musculoskeletal:     Comments: WC bound movements random. Limited by mild contracturing.  Lymphadenopathy:     Cervical: No cervical adenopathy.  Skin:    General: Skin is warm and dry.     Findings: Rash (mild patch of exzema 4 CM LQ. Another 2X 3 cm R calf) present.  Neurological:     Mental Status: She is alert and oriented to person, place, and time.     Deep Tendon Reflexes: Reflexes are normal and symmetric.  Psychiatric:        Mood and Affect: Mood normal.       Assessment & Plan:   Abigail Hartman was seen today for clearance for dental procedure.  Diagnoses and all orders for this visit:  Cerebral palsy, unspecified type ([place]) -     CBC with Differential/Platelet -     CMP14+EGFR -     Lipid panel -     VITAMIN D 25 Hydroxy (Vit-D Deficiency, Fractures)  Well adult exam  Other orders -     fluocinonide-emollient (LIDEX-E) 0.05 % cream; Apply 1 application topically 2 (two) times daily. To affected areas -     fluconazole (DIFLUCAN) 150 MG tablet; Take 1 tablet (150 mg total) by mouth once for 1 dose. At onset of symptoms. Repeat at end of treatment       I am having Abigail Hartman start on fluocinonide-emollient and fluconazole. I am also having her maintain her Vitamin D2, acetaminophen, polyethylene glycol powder, hydrOXYzine, cloNIDine HCl, famotidine, zonisamide, Amantadine HCl, tiZANidine, diazepam, and levothyroxine.  Allergies as of 11/15/2018      Reactions   Motrin [ibuprofen] Rash      Medication List       Accurate as of November 15, 2018  4:11 PM. If you have any questions, ask your nurse or doctor.        acetaminophen 160 MG/5ML solution Commonly known as: TYLENOL Take 240 mg by mouth every 6 (six) hours as needed for  fever.   Amantadine HCl 100 MG tablet Take 1/2 (one-half) tablet by mouth twice daily   cloNIDine HCl 0.1 MG Tb12 ER tablet Commonly known as: KAPVAY TAKE 1 TABLET BY MOUTH IN THE MORNING AND 1 IN THE EVENING AND 2 AT BEDTIME   diazepam 2 MG tablet Commonly known as: VALIUM Give 1 tablet at bedtime   famotidine 20 MG tablet Commonly known as: Pepcid Take 1 tablet (20 mg total) by mouth 2 (two) times daily.   fluconazole 150 MG tablet Commonly known as: DIFLUCAN Take 1 tablet (150 mg total) by mouth once for 1 dose. At onset of symptoms. Repeat at end of treatment Started by: Claretta Fraise, MD   fluocinonide-emollient 0.05 % cream Commonly known as: LIDEX-E Apply 1 application topically 2 (two) times daily. To affected areas Started by: Claretta Fraise, MD   hydrOXYzine 25 MG tablet Commonly known as: ATARAX/VISTARIL Take 1 tablet (25 mg total) by mouth 3 (three) times daily as needed.   levothyroxine 50 MCG tablet Commonly known as: SYNTHROID Take 1 tablet (50 mcg total) by mouth daily.   polyethylene glycol powder 17 GM/SCOOP powder Commonly known as: GLYCOLAX/MIRALAX Take 17 g by mouth daily as needed for moderate constipation. For constipation   tiZANidine 2 MG tablet Commonly known as: ZANAFLEX Take 1 tablet (2 mg total) by mouth at bedtime. TAKE 1 TABLET BY MOUTH AT NIGHT   Vitamin D2 50 MCG (2000 UT) Tabs Take by mouth.   zonisamide 100 MG capsule Commonly known as: ZONEGRAN TAKE 2 CAPSULES BY MOUTH AT BEDTIME        Follow-up: Return in about 1 year (around 11/15/2019), or if symptoms worsen or fail to improve.  Claretta Fraise, M.D.

## 2018-11-16 ENCOUNTER — Other Ambulatory Visit: Payer: Self-pay | Admitting: *Deleted

## 2018-11-16 ENCOUNTER — Other Ambulatory Visit: Payer: Self-pay | Admitting: Family Medicine

## 2018-11-16 ENCOUNTER — Telehealth: Payer: Self-pay | Admitting: *Deleted

## 2018-11-16 DIAGNOSIS — E559 Vitamin D deficiency, unspecified: Secondary | ICD-10-CM

## 2018-11-16 DIAGNOSIS — E7800 Pure hypercholesterolemia, unspecified: Secondary | ICD-10-CM

## 2018-11-16 LAB — CBC WITH DIFFERENTIAL/PLATELET
Basophils Absolute: 0.1 10*3/uL (ref 0.0–0.2)
Basos: 1 %
EOS (ABSOLUTE): 0.3 10*3/uL (ref 0.0–0.4)
Eos: 4 %
Hematocrit: 39.5 % (ref 34.0–46.6)
Hemoglobin: 13 g/dL (ref 11.1–15.9)
Immature Grans (Abs): 0 10*3/uL (ref 0.0–0.1)
Immature Granulocytes: 0 %
Lymphocytes Absolute: 3 10*3/uL (ref 0.7–3.1)
Lymphs: 36 %
MCH: 27.5 pg (ref 26.6–33.0)
MCHC: 32.9 g/dL (ref 31.5–35.7)
MCV: 84 fL (ref 79–97)
Monocytes Absolute: 0.5 10*3/uL (ref 0.1–0.9)
Monocytes: 7 %
Neutrophils Absolute: 4.5 10*3/uL (ref 1.4–7.0)
Neutrophils: 52 %
Platelets: 333 10*3/uL (ref 150–450)
RBC: 4.72 x10E6/uL (ref 3.77–5.28)
RDW: 14.7 % (ref 11.7–15.4)
WBC: 8.4 10*3/uL (ref 3.4–10.8)

## 2018-11-16 LAB — CMP14+EGFR
ALT: 17 IU/L (ref 0–32)
AST: 20 IU/L (ref 0–40)
Albumin/Globulin Ratio: 1.7 (ref 1.2–2.2)
Albumin: 4.8 g/dL (ref 3.9–5.0)
Alkaline Phosphatase: 113 IU/L (ref 39–117)
BUN/Creatinine Ratio: 24 — ABNORMAL HIGH (ref 9–23)
BUN: 15 mg/dL (ref 6–20)
Bilirubin Total: <0.2 mg/dL (ref 0.0–1.2)
CO2: 22 mmol/L (ref 20–29)
Calcium: 9.4 mg/dL (ref 8.7–10.2)
Chloride: 107 mmol/L — ABNORMAL HIGH (ref 96–106)
Creatinine, Ser: 0.62 mg/dL (ref 0.57–1.00)
GFR calc Af Amer: 147 mL/min/{1.73_m2} (ref >59)
GFR calc non Af Amer: 128 mL/min/{1.73_m2} (ref >59)
Globulin, Total: 2.8 g/dL (ref 1.5–4.5)
Glucose: 80 mg/dL (ref 65–99)
Potassium: 4.4 mmol/L (ref 3.5–5.2)
Sodium: 144 mmol/L (ref 134–144)
Total Protein: 7.6 g/dL (ref 6.0–8.5)

## 2018-11-16 LAB — VITAMIN D 25 HYDROXY (VIT D DEFICIENCY, FRACTURES): Vit D, 25-Hydroxy: 28 ng/mL — ABNORMAL LOW (ref 30.0–100.0)

## 2018-11-16 LAB — LIPID PANEL
Chol/HDL Ratio: 5.5 ratio — ABNORMAL HIGH (ref 0.0–4.4)
Cholesterol, Total: 287 mg/dL — ABNORMAL HIGH (ref 100–199)
HDL: 52 mg/dL (ref >39)
LDL Calculated: 195 mg/dL — ABNORMAL HIGH (ref 0–99)
Triglycerides: 199 mg/dL — ABNORMAL HIGH (ref 0–149)
VLDL Cholesterol Cal: 40 mg/dL (ref 5–40)

## 2018-11-16 MED ORDER — TRIAMCINOLONE ACETONIDE 0.1 % EX CREA: 1.0000 "application " | TOPICAL_CREAM | Freq: Three times a day (TID) | CUTANEOUS | 0 refills | Status: DC

## 2018-11-16 MED ORDER — VITAMIN D (ERGOCALCIFEROL) 1.25 MG (50000 UNIT) PO CAPS: 50000.0000 [IU] | ORAL_CAPSULE | ORAL | 3 refills | Status: DC

## 2018-11-16 MED ORDER — ATORVASTATIN CALCIUM 40 MG PO TABS: 40.0000 mg | ORAL_TABLET | Freq: Every day | ORAL | 3 refills | Status: DC

## 2018-11-16 NOTE — Telephone Encounter (Signed)
Lidex-E Non Preferred for Medicaid  Pt must try and fail 2 preferred- betamethasone valerate cream/ointment or triamcinolone acetonide cream/lotion/ointment

## 2018-11-16 NOTE — Telephone Encounter (Signed)
I sent in the requested prescription 

## 2019-01-03 ENCOUNTER — Other Ambulatory Visit (INDEPENDENT_AMBULATORY_CARE_PROVIDER_SITE_OTHER): Payer: Self-pay | Admitting: Family

## 2019-01-03 ENCOUNTER — Other Ambulatory Visit: Payer: Self-pay | Admitting: Family Medicine

## 2019-01-03 DIAGNOSIS — G4700 Insomnia, unspecified: Secondary | ICD-10-CM

## 2019-01-03 DIAGNOSIS — G40309 Generalized idiopathic epilepsy and epileptic syndromes, not intractable, without status epilepticus: Secondary | ICD-10-CM

## 2019-01-03 DIAGNOSIS — F72 Severe intellectual disabilities: Secondary | ICD-10-CM

## 2019-01-03 NOTE — Telephone Encounter (Signed)
Please send to the pharmacy °

## 2019-01-03 NOTE — Telephone Encounter (Signed)
No thyroid level since 12/2014.

## 2019-01-23 ENCOUNTER — Other Ambulatory Visit (INDEPENDENT_AMBULATORY_CARE_PROVIDER_SITE_OTHER): Payer: Self-pay | Admitting: Family

## 2019-01-23 DIAGNOSIS — G801 Spastic diplegic cerebral palsy: Secondary | ICD-10-CM

## 2019-01-23 DIAGNOSIS — G4700 Insomnia, unspecified: Secondary | ICD-10-CM

## 2019-02-23 ENCOUNTER — Other Ambulatory Visit (INDEPENDENT_AMBULATORY_CARE_PROVIDER_SITE_OTHER): Payer: Self-pay | Admitting: Family

## 2019-02-23 DIAGNOSIS — G4700 Insomnia, unspecified: Secondary | ICD-10-CM

## 2019-02-23 DIAGNOSIS — G801 Spastic diplegic cerebral palsy: Secondary | ICD-10-CM

## 2019-02-28 ENCOUNTER — Other Ambulatory Visit (INDEPENDENT_AMBULATORY_CARE_PROVIDER_SITE_OTHER): Payer: Self-pay | Admitting: Family

## 2019-02-28 DIAGNOSIS — G801 Spastic diplegic cerebral palsy: Secondary | ICD-10-CM

## 2019-03-24 ENCOUNTER — Other Ambulatory Visit (INDEPENDENT_AMBULATORY_CARE_PROVIDER_SITE_OTHER): Payer: Self-pay | Admitting: Family

## 2019-03-24 DIAGNOSIS — G801 Spastic diplegic cerebral palsy: Secondary | ICD-10-CM

## 2019-04-03 ENCOUNTER — Other Ambulatory Visit (INDEPENDENT_AMBULATORY_CARE_PROVIDER_SITE_OTHER): Payer: Self-pay | Admitting: Family

## 2019-04-03 DIAGNOSIS — G4700 Insomnia, unspecified: Secondary | ICD-10-CM

## 2019-04-06 ENCOUNTER — Other Ambulatory Visit: Payer: Self-pay | Admitting: Family Medicine

## 2019-04-06 NOTE — Telephone Encounter (Signed)
OV 11/15/18 rtc 1 yr

## 2019-04-27 ENCOUNTER — Other Ambulatory Visit: Payer: Self-pay | Admitting: Family Medicine

## 2019-05-05 ENCOUNTER — Other Ambulatory Visit (INDEPENDENT_AMBULATORY_CARE_PROVIDER_SITE_OTHER): Payer: Self-pay | Admitting: Family

## 2019-05-05 DIAGNOSIS — G4700 Insomnia, unspecified: Secondary | ICD-10-CM

## 2019-05-23 ENCOUNTER — Other Ambulatory Visit (INDEPENDENT_AMBULATORY_CARE_PROVIDER_SITE_OTHER): Payer: Self-pay | Admitting: Family

## 2019-05-23 ENCOUNTER — Other Ambulatory Visit: Payer: Self-pay | Admitting: Family Medicine

## 2019-05-23 DIAGNOSIS — G801 Spastic diplegic cerebral palsy: Secondary | ICD-10-CM

## 2019-05-23 NOTE — Telephone Encounter (Signed)
Phone number not working.

## 2019-05-23 NOTE — Telephone Encounter (Signed)
[facility]. NTBS needs labs 30 days given 04/27/19

## 2019-05-24 ENCOUNTER — Other Ambulatory Visit (INDEPENDENT_AMBULATORY_CARE_PROVIDER_SITE_OTHER): Payer: Self-pay

## 2019-05-24 ENCOUNTER — Other Ambulatory Visit: Payer: Self-pay | Admitting: Family Medicine

## 2019-05-24 DIAGNOSIS — G4700 Insomnia, unspecified: Secondary | ICD-10-CM

## 2019-05-24 DIAGNOSIS — G801 Spastic diplegic cerebral palsy: Secondary | ICD-10-CM

## 2019-05-25 ENCOUNTER — Other Ambulatory Visit: Payer: Self-pay | Admitting: Family Medicine

## 2019-05-25 ENCOUNTER — Other Ambulatory Visit (INDEPENDENT_AMBULATORY_CARE_PROVIDER_SITE_OTHER): Payer: Self-pay | Admitting: Family

## 2019-05-25 DIAGNOSIS — F72 Severe intellectual disabilities: Secondary | ICD-10-CM

## 2019-05-25 DIAGNOSIS — G4700 Insomnia, unspecified: Secondary | ICD-10-CM

## 2019-05-25 MED ORDER — TIZANIDINE HCL 2 MG PO TABS: 2.0000 mg | ORAL_TABLET | Freq: Every day | ORAL | 0 refills | Status: DC

## 2019-05-25 MED ORDER — AMANTADINE HCL 100 MG PO TABS: ORAL_TABLET | ORAL | 0 refills | Status: DC

## 2019-05-28 MED ORDER — LEVOTHYROXINE SODIUM 50 MCG PO TABS: 50.0000 ug | ORAL_TABLET | Freq: Every day | ORAL | 0 refills | Status: DC

## 2019-05-31 ENCOUNTER — Telehealth (INDEPENDENT_AMBULATORY_CARE_PROVIDER_SITE_OTHER): Payer: Medicaid Other | Admitting: Family

## 2019-06-05 ENCOUNTER — Ambulatory Visit (INDEPENDENT_AMBULATORY_CARE_PROVIDER_SITE_OTHER): Payer: Medicaid Other | Admitting: Family

## 2019-06-05 VITALS — Wt 72.0 lb

## 2019-06-05 DIAGNOSIS — G40309 Generalized idiopathic epilepsy and epileptic syndromes, not intractable, without status epilepticus: Secondary | ICD-10-CM

## 2019-06-05 DIAGNOSIS — K219 Gastro-esophageal reflux disease without esophagitis: Secondary | ICD-10-CM | POA: Diagnosis not present

## 2019-06-05 DIAGNOSIS — G4700 Insomnia, unspecified: Secondary | ICD-10-CM

## 2019-06-05 DIAGNOSIS — G801 Spastic diplegic cerebral palsy: Secondary | ICD-10-CM

## 2019-06-05 DIAGNOSIS — F72 Severe intellectual disabilities: Secondary | ICD-10-CM | POA: Diagnosis not present

## 2019-06-05 MED ORDER — FAMOTIDINE 20 MG PO TABS: 20.0000 mg | ORAL_TABLET | Freq: Two times a day (BID) | ORAL | 3 refills | Status: DC

## 2019-06-05 MED ORDER — TIZANIDINE HCL 2 MG PO TABS: 2.0000 mg | ORAL_TABLET | Freq: Every day | ORAL | 3 refills | Status: DC

## 2019-06-05 MED ORDER — HYDROXYZINE HCL 25 MG PO TABS: 25.0000 mg | ORAL_TABLET | Freq: Three times a day (TID) | ORAL | 5 refills | Status: DC | PRN

## 2019-06-05 MED ORDER — CLONIDINE HCL ER 0.1 MG PO TB12: ORAL_TABLET | ORAL | 3 refills | Status: DC

## 2019-06-05 MED ORDER — ZONISAMIDE 100 MG PO CAPS: 200.0000 mg | ORAL_CAPSULE | Freq: Every day | ORAL | 5 refills | Status: DC

## 2019-06-05 MED ORDER — AMANTADINE HCL 100 MG PO TABS: ORAL_TABLET | ORAL | 3 refills | Status: DC

## 2019-06-05 MED ORDER — DIAZEPAM 2 MG PO TABS: ORAL_TABLET | ORAL | 3 refills | Status: DC

## 2019-06-05 NOTE — Progress Notes (Signed)
This is a Pediatric Specialist E-Visit follow up consult provided via Telephone Abigail Hartman and their parent/guardian Abigail Hartman consented to an E-Visit consult today.  Location of patient: Abigail Hartman is at home Location of provider: Normand Sloop is at office Patient was referred by Claretta Fraise, MD   The following participants were involved in this E-Visit: CMA, NP and patient's guardian  Chief Complain/ Reason for E-Visit today: follow up for seizures, CP  Total time on call: 15 min Follow up: 6 months  Abigail Hartman   MRN:  [id number]  Aug 14, 1995   Provider: Rockwell Germany NP-C Location of Care: [facility]  Visit type: Routine visit  Last visit: 07/18/2018  Referral source:  Redge Gainer, MD History from: mother and chcn chart  Brief history:  Copied from previous record: 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 She does not have pituitary dysfunction. She has hypothyroidism that is managed by her PCP. She has generalized tonic-clonic seizures. She is taking and tolerating Zonisamide for her seizure disorder and has remained seizure free since September 2016. She takes Amantadine and Tizanidine for spasticity and movement, Clonidine ER for behavior and Hydroxyzine for sleep. She can be aggressive at times, pinching or pulling hair, particularly when she is frustrated or anxious. She is in the care of her grandparents.  Today's concerns:  Grandmother reports today that Abigail Hartman has remained seizure free since her last visit. She has not experienced increase in spasticity and seems comfortable most of the time. She continues to have bouts of aggressiveness at times, which her grandmother manages by stopping activities and allowing Abigail Hartman time to calm  down. Grandmother says that Abigail Hartman continues to have some problems with sleep but has been sleeping longer at night.   Abigail Hartman has been otherwise generally healthy since she was last seen. Grandmother has no other health concerns for her today other than previously mentioned.   Review of systems: Please see HPI for neurologic and other pertinent review of systems. Otherwise all other systems were reviewed and were negative.  Problem List: Patient Active Problem List   Diagnosis Date Noted  . 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 Insomnia 12/03/2014  . Cerebral palsy ([place]) 05/07/2014  . Hypoxic ischemic encephalopathy, neonatal onset, severe 04/26/2014  . Optic nerve hypoplasia of both eyes 04/26/2014  . Blindness 04/26/2014  . Severe intellectual disability 04/26/2014  . GERD (gastroesophageal reflux disease) 07/18/2012  . Hypothyroidism 07/18/2012     Past Medical History:  Diagnosis Date  . Blind   . Developmental delay, severe   . Hypothyroidism   . Seizures ([place])     Past medical history comments: See HPI Copied from previous record: Her last seizure occurred on December 18, 2014; prior to that 2011; seizures were generalized tonic-clonic in nature.  She has problems with insomnia and sometimes will be awake for three or four days straight and then sleep.  I reviewed her last evaluation at [facility]. Her last video EEG was poorly organized and showed delta and beta range activity. There were four push button events with no EEG seizure correlates and she had frequent eye movement artifact.  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  In the past, she had numerous hospitalizations for recurrent seizures, pneumonia, and at one point had a problem with aspiration pneumonia and a gastrostomy tube was  placed for about one to two years. It was removed about two years ago.  She is followed by Mcneil So[facility] at [facility] for endocrinology and also by pediatric nephrology at [facility]. She has also been followed at [facility] by orthopedic surgery and physical therapy.  Surgical history: Past Surgical History:  Procedure Laterality Date  . GI TUBE  2011     Family history: family history is not on file.   Social history: Social History   Socioeconomic History  . Marital status: Single    Spouse name: Not on file  . Number of children: Not on file  . Years of education: Not on file  . Highest education level: Not on file  Occupational History  . Not on file  Tobacco Use  . Smoking status: Passive Smoke Exposure - Never Smoker  . Smokeless tobacco: Never Used  Substance and Sexual Activity  . Alcohol use: No    Alcohol/week: 0.0 standard drinks  . Drug use: No  . Sexual activity: Never  Other Topics Concern  . Not on file  Social History Narrative   Abigail Hartman is a 24 yo female.   She graduated from [employer].   She lives with both parents.   She enjoys listening to music.   Social Determinants of Health   Financial Resource Strain:   . Difficulty of Paying Living Expenses: Not on file  Food Insecurity:   . Worried About Charity fundraiser in the Last Year: Not on file  . Ran Out of Food in the Last Year: Not on file  Transportation Needs:   . Lack of Transportation (Medical): Not on file  . Lack of Transportation (Non-Medical): Not on file  Physical Activity:   . Days of Exercise per Week: Not on file  . Minutes of Exercise per Session: Not on file  Stress:   . Feeling of Stress : Not on file  Social Connections:   . Frequency of Communication with Friends and Family: Not on file  . Frequency of Social Gatherings with Friends and Family: Not on file  . Attends Religious Services: Not on file  . Active Member of Clubs or Organizations: Not on file  .  Attends Archivist Meetings: Not on file  . Marital Status: Not on file  Intimate Partner Violence:   . Fear of Current or Ex-Partner: Not on file  . Emotionally Abused: Not on file  . Physically Abused: Not on file  . Sexually Abused: Not on file    Past/failed meds:   Allergies: Allergies  Allergen Reactions  . Motrin [Ibuprofen] Rash      Immunizations: Immunization History  Administered Date(s) Administered  . DTaP 10/19/1995, 12/21/1995, 02/29/1996, 04/25/2000  . Hepatitis B, ped/adol 10/19/1995  . HiB (PRP-T) 10/19/1995, 12/21/1995, 02/29/1996  . IPV 10/19/1995, 12/21/1995, 08/29/1996, 04/25/2000  . Influenza, Seasonal, Injecte, Preservative Fre 12/29/2009, 04/27/2010, 04/13/2011  . Influenza,inj,Quad PF,6+ Mos 03/06/2014, 01/14/2015, 03/15/2016  . MMR 08/29/1996, 04/25/2000  . Pneumococcal Conjugate-13 06/05/1999  . Tdap 11/28/2007  . Varicella 08/29/1996    Diagnostics/Screenings:  Physical Exam: Wt 72 lb (32.7 kg)   BMI 21.97 kg/m   There was no physical exam or vital signs as it was a telephone visit.   Impression: 1. Generalized convulsive epilepsy 2. Insomnia 3. Bilateral optic nerve hypoplasia 4. 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 Severe intellectual disability 6. Intermittent aggressive behavior.   Recommendations for plan of care: The patient's previous [facility] records were reviewed. Prisha has neither had nor required imaging or lab studies since the last visit. 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 She is taking and tolerating Zonisamide and has remained seizure free since September 2016. I commended grandmother for how she manages Illyanna's aggressive behavior. She is doing well at this time and I  will make no changes in her medication regimen at this time. I will see Ahley back in follow up in 6 months or sooner if needed. Grandmother agreed with these plans.   The medication list was reviewed and reconciled. No changes were made in the prescribed medications today. A complete medication list was provided to the patient.  Allergies as of 06/05/2019      Reactions   Motrin [ibuprofen] Rash      Medication List       Accurate as of June 05, 2019  8:21 AM. If you have any questions, ask your nurse or doctor.        acetaminophen 160 MG/5ML solution Commonly known as: TYLENOL Take 240 mg by mouth every 6 (six) hours as needed for fever.   Amantadine HCl 100 MG tablet Take 1/2 (one-half) tablet by mouth twice daily   atorvastatin 40 MG tablet Commonly known as: LIPITOR Take 1 tablet (40 mg total) by mouth daily. For cholesterol   cloNIDine HCl 0.1 MG Tb12 ER tablet Commonly known as: KAPVAY TAKE 1 TABLET BY MOUTH IN THE MORNING AND 1 IN THE EVENING AND 2 AT BEDTIME   diazepam 2 MG tablet Commonly known as: VALIUM GIVE 1 TABLET BY MOUTH AT BEDTIME   famotidine 20 MG tablet Commonly known as: Pepcid Take 1 tablet (20 mg total) by mouth 2 (two) times daily.   fluocinonide-emollient 0.05 % cream Commonly known as: LIDEX-E Apply 1 application topically 2 (two) times daily. To affected areas   hydrOXYzine 25 MG tablet Commonly known as: ATARAX/VISTARIL Take 1 tablet (25 mg total) by mouth 3 (three) times daily as needed.   levothyroxine 50 MCG tablet Commonly known as: SYNTHROID Take 1 tablet (50 mcg total) by mouth daily. (needs labwork)   Euthyrox 50 MCG tablet Generic drug: levothyroxine TAKE 1 TABLET BY MOUTH ONCE DAILY NEEDS  LAB  WORK   polyethylene glycol powder 17 GM/SCOOP powder Commonly known as: GLYCOLAX/MIRALAX TAKE 17 GRAMS BY MOUTH DAILY AS NEEDED FOR MODERATE CONSTIPATION   tiZANidine 2 MG tablet Commonly known as: ZANAFLEX Take 1 tablet (2 mg  total) by mouth at bedtime. TAKE 1 TABLET BY MOUTH AT BEDTIME   triamcinolone cream 0.1 % Commonly known as: KENALOG Apply 1 application topically 3 (three) times daily. Avoid face and genitalia   Vitamin D (Ergocalciferol) 1.25 MG (50000 UNIT) Caps capsule Commonly known as: DRISDOL Take 1 capsule (50,000 Units total) by mouth every 7 (seven) days.   zonisamide 100 MG capsule Commonly known as: ZONEGRAN TAKE 2 CAPSULES BY MOUTH AT BEDTIME       Total time spent on the phone with the patient's grandmother was 15 minutes, of which 50% or more was spent in counseling and coordination of care.  Rockwell Germany NP-C [facility] Ph. [phone] Fax [phone]

## 2019-06-08 ENCOUNTER — Encounter (INDEPENDENT_AMBULATORY_CARE_PROVIDER_SITE_OTHER): Payer: Self-pay | Admitting: Family

## 2019-06-08 NOTE — Patient Instructions (Signed)
Thank you for talking with me today.   Instructions for you until your next appointment are as follows: 1. Continue Abigail Hartman's medications as you have been giving them 2. Let me know if she has any seizures 3. Please sign up for MyChart if you have not done so 4. Please plan to return for follow up in 6 months or sooner if needed.

## 2019-07-17 ENCOUNTER — Other Ambulatory Visit: Payer: Self-pay | Admitting: Family Medicine

## 2019-08-08 ENCOUNTER — Ambulatory Visit: Payer: Medicaid Other | Admitting: Family Medicine

## 2019-08-13 ENCOUNTER — Other Ambulatory Visit: Payer: Self-pay | Admitting: Family Medicine

## 2019-08-13 ENCOUNTER — Telehealth: Payer: Self-pay | Admitting: Family Medicine

## 2019-08-13 DIAGNOSIS — G4700 Insomnia, unspecified: Secondary | ICD-10-CM

## 2019-08-13 MED ORDER — LEVOTHYROXINE SODIUM 50 MCG PO TABS: ORAL_TABLET | ORAL | 0 refills | Status: DC

## 2019-08-13 MED ORDER — AMANTADINE HCL 100 MG PO TABS: ORAL_TABLET | ORAL | 3 refills | Status: DC

## 2019-08-13 NOTE — Telephone Encounter (Signed)
Done. Thanks, WS 

## 2019-08-13 NOTE — Telephone Encounter (Signed)
  Prescription Request  08/13/2019  What is the name of the medication or equipment? levothyroxine (SYNTHROID) 50 MCG tablet  Amantadine HCl 100 MG tablet   Have you contacted your pharmacy to request a refill? (if applicable) yes  Which pharmacy would you like this sent to? [facility] [place]   Patient notified that their request is being sent to the clinical staff for review and that they should receive a response within 2 business days.

## 2019-08-13 NOTE — Telephone Encounter (Signed)
No thyroid panel since 2016.  Please advise.

## 2019-08-14 NOTE — Telephone Encounter (Signed)
Aware medication has been sent  

## 2019-08-20 ENCOUNTER — Telehealth: Payer: Self-pay | Admitting: Family Medicine

## 2019-08-20 NOTE — Telephone Encounter (Signed)
Rx on [facility] desk to be signed, then will fax

## 2019-08-25 ENCOUNTER — Other Ambulatory Visit (INDEPENDENT_AMBULATORY_CARE_PROVIDER_SITE_OTHER): Payer: Self-pay | Admitting: Family

## 2019-08-25 DIAGNOSIS — G4700 Insomnia, unspecified: Secondary | ICD-10-CM

## 2019-08-30 ENCOUNTER — Telehealth (INDEPENDENT_AMBULATORY_CARE_PROVIDER_SITE_OTHER): Payer: Self-pay | Admitting: Family

## 2019-08-30 ENCOUNTER — Encounter: Payer: Self-pay | Admitting: Family Medicine

## 2019-08-30 ENCOUNTER — Other Ambulatory Visit: Payer: Self-pay

## 2019-08-30 ENCOUNTER — Ambulatory Visit (INDEPENDENT_AMBULATORY_CARE_PROVIDER_SITE_OTHER): Payer: Medicaid Other | Admitting: Family Medicine

## 2019-08-30 VITALS — BP 94/67 | HR 77 | Temp 97.6°F | Ht <= 58 in | Wt <= 1120 oz

## 2019-08-30 DIAGNOSIS — G809 Cerebral palsy, unspecified: Secondary | ICD-10-CM

## 2019-08-30 DIAGNOSIS — E038 Other specified hypothyroidism: Secondary | ICD-10-CM | POA: Diagnosis not present

## 2019-08-30 DIAGNOSIS — G801 Spastic diplegic cerebral palsy: Secondary | ICD-10-CM

## 2019-08-30 DIAGNOSIS — E559 Vitamin D deficiency, unspecified: Secondary | ICD-10-CM | POA: Diagnosis not present

## 2019-08-30 DIAGNOSIS — G4700 Insomnia, unspecified: Secondary | ICD-10-CM

## 2019-08-30 MED ORDER — AMANTADINE HCL 100 MG PO TABS: ORAL_TABLET | ORAL | 3 refills | Status: DC

## 2019-08-30 NOTE — Telephone Encounter (Signed)
I called and talked to Mom. I told her that Amantadine requires a PA with Medicaid and that it had been done. I explained that the medication was prescribed for spasticity. Mom asked for refill to be sent in and I did so. TG

## 2019-08-30 NOTE — Telephone Encounter (Signed)
Who's calling (name and relationship to patient) : Marolyn Haller mom   Best contact number: [phone]  Provider they see: Elveria Rising  Reason for call: Medicaid no longer covers amantadine PCP also would like to know why the patient is taking this medication   Call ID:      PRESCRIPTION REFILL ONLY  Name of prescription:  Pharmacy:

## 2019-08-31 ENCOUNTER — Other Ambulatory Visit: Payer: Self-pay | Admitting: Family Medicine

## 2019-08-31 LAB — CBC WITH DIFFERENTIAL/PLATELET
Basophils Absolute: 0 10*3/uL (ref 0.0–0.2)
Basos: 0 %
EOS (ABSOLUTE): 0.3 10*3/uL (ref 0.0–0.4)
Eos: 4 %
Hematocrit: 37.7 % (ref 34.0–46.6)
Hemoglobin: 12.4 g/dL (ref 11.1–15.9)
Immature Grans (Abs): 0 10*3/uL (ref 0.0–0.1)
Immature Granulocytes: 0 %
Lymphocytes Absolute: 2.5 10*3/uL (ref 0.7–3.1)
Lymphs: 28 %
MCH: 27.4 pg (ref 26.6–33.0)
MCHC: 32.9 g/dL (ref 31.5–35.7)
MCV: 83 fL (ref 79–97)
Monocytes Absolute: 0.5 10*3/uL (ref 0.1–0.9)
Monocytes: 5 %
Neutrophils Absolute: 5.4 10*3/uL (ref 1.4–7.0)
Neutrophils: 63 %
Platelets: 302 10*3/uL (ref 150–450)
RBC: 4.52 x10E6/uL (ref 3.77–5.28)
RDW: 14.4 % (ref 11.7–15.4)
WBC: 8.7 10*3/uL (ref 3.4–10.8)

## 2019-08-31 LAB — CMP14+EGFR
ALT: 30 IU/L (ref 0–32)
AST: 34 IU/L (ref 0–40)
Albumin/Globulin Ratio: 1.8 (ref 1.2–2.2)
Albumin: 4.8 g/dL (ref 3.9–5.0)
Alkaline Phosphatase: 131 IU/L — ABNORMAL HIGH (ref 48–121)
BUN/Creatinine Ratio: 21 (ref 9–23)
BUN: 11 mg/dL (ref 6–20)
Bilirubin Total: <0.2 mg/dL (ref 0.0–1.2)
CO2: 22 mmol/L (ref 20–29)
Calcium: 9.6 mg/dL (ref 8.7–10.2)
Chloride: 105 mmol/L (ref 96–106)
Creatinine, Ser: 0.53 mg/dL — ABNORMAL LOW (ref 0.57–1.00)
GFR calc Af Amer: 154 mL/min/{1.73_m2} (ref >59)
GFR calc non Af Amer: 133 mL/min/{1.73_m2} (ref >59)
Globulin, Total: 2.7 g/dL (ref 1.5–4.5)
Glucose: 70 mg/dL (ref 65–99)
Potassium: 4.2 mmol/L (ref 3.5–5.2)
Sodium: 144 mmol/L (ref 134–144)
Total Protein: 7.5 g/dL (ref 6.0–8.5)

## 2019-08-31 LAB — TSH+FREE T4
Free T4: 1.35 ng/dL (ref 0.82–1.77)
TSH: 0.476 u[IU]/mL (ref 0.450–4.500)

## 2019-08-31 LAB — LIPID PANEL
Chol/HDL Ratio: 2.7 ratio (ref 0.0–4.4)
Cholesterol, Total: 139 mg/dL (ref 100–199)
HDL: 51 mg/dL (ref >39)
LDL Chol Calc (NIH): 70 mg/dL (ref 0–99)
Triglycerides: 97 mg/dL (ref 0–149)
VLDL Cholesterol Cal: 18 mg/dL (ref 5–40)

## 2019-08-31 LAB — VITAMIN D 25 HYDROXY (VIT D DEFICIENCY, FRACTURES): Vit D, 25-Hydroxy: 148 ng/mL — ABNORMAL HIGH (ref 30.0–100.0)

## 2019-09-04 ENCOUNTER — Encounter: Payer: Self-pay | Admitting: Family Medicine

## 2019-09-04 ENCOUNTER — Other Ambulatory Visit: Payer: Self-pay | Admitting: *Deleted

## 2019-09-04 MED ORDER — LEVOTHYROXINE SODIUM 50 MCG PO TABS: ORAL_TABLET | ORAL | 2 refills | Status: DC

## 2019-09-04 NOTE — Progress Notes (Addendum)
Subjective:  Patient ID: Abigail Hartman, female    DOB: [birth year]  Age: 24 y.o. MRN: [id number]  CC: Follow-up   HPI Abigail Hartman presents for follow-up evaluation of her cerebral palsy.  Patient is nonverbal.  She sways her head side to side sits in a wheelchair with seatbelts in place to prevent injury.  She has a history of hypothyroidism and is due to have that checked again today.  No overt symptoms noted.  She tends to be very small sized and low body mass index.  She is not good about needing and has to have supplements with boost in order to maintain  basic nutrition.  She is 24 years old and weighs 70 pounds.  Her guardian states that they can no longer get the amantadine on her insurance.  There was no substitute.  However, there is no clear indication for the amantadine on chart review and decision was made to hold the medicine for the time being.  We will monitor her sodium.  Depression screen [facility] 2/9 08/30/2019 11/15/2018  Decreased Interest 0 0  Down, Depressed, Hopeless 0 0  PHQ - 2 Score 0 0    History Sanela has a past medical history of Blind, Developmental delay, severe, Hypothyroidism, and Seizures ([place]).   She has a past surgical history that includes GI TUBE (2011).   Her family history is not on file.She reports that she is a non-smoker but has been exposed to tobacco smoke. She has never used smokeless tobacco. She reports that she does not drink alcohol or use drugs.    ROS Review of Systems  Unable to perform ROS: Patient nonverbal    Objective:  BP 94/67    Pulse 77    Temp 97.6 F (36.4 C) (Temporal)    Ht 4' (1.219 m)    Wt 70 lb (31.8 kg) Comment: Unable   BMI 21.36 kg/m   BP Readings from Last 3 Encounters:  08/30/19 94/67  11/15/18 98/66  12/27/16 100/66    Wt Readings from Last 3 Encounters:  08/30/19 70 lb (31.8 kg)  06/05/19 72 lb (32.7 kg)  12/27/16 64 lb (29 kg)     Physical Exam HENT:     Head: Normocephalic.     Right Ear: Tympanic  membrane normal.     Left Ear: Tympanic membrane normal.     Nose: Nose normal.     Mouth/Throat:     Mouth: Mucous membranes are moist.  Eyes:     Pupils: Pupils are equal, round, and reactive to light.  Cardiovascular:     Rate and Rhythm: Normal rate and regular rhythm.     Pulses: Normal pulses.  Pulmonary:     Effort: Pulmonary effort is normal.     Breath sounds: Normal breath sounds.  Abdominal:     General: Abdomen is flat.     Palpations: Abdomen is soft.  Musculoskeletal:        General: No swelling.  Skin:    General: Skin is warm and dry.     Coloration: Skin is pale. Skin is not jaundiced.     Findings: No erythema or rash.  Neurological:     Mental Status: She is alert. She is disoriented.     Coordination: Coordination abnormal.       Assessment & Plan:   Lachrisha was seen today for follow-up.  Diagnoses and all orders for this visit:  Vitamin D deficiency -     CBC with Differential/Platelet -  CMP14+EGFR -     Lipid panel -     TSH + free T4 -     VITAMIN D 25 Hydroxy (Vit-D Deficiency, Fractures)  Hypoxic ischemic encephalopathy, neonatal onset, severe -     CBC with Differential/Platelet -     CMP14+EGFR -     Lipid panel -     TSH + free T4  Other specified hypothyroidism -     CBC with Differential/Platelet -     CMP14+EGFR -     Lipid panel -     TSH + free T4       I have discontinued Zephyra Fomby's levothyroxine. I am also having her maintain her acetaminophen, fluocinonide-emollient, atorvastatin, triamcinolone cream, polyethylene glycol powder, zonisamide, cloNIDine HCl, diazepam, tiZANidine, famotidine, and hydrOXYzine.  Allergies as of 08/30/2019      Reactions   Motrin [ibuprofen] Rash      Medication List       Accurate as of August 30, 2019 11:59 PM. If you have any questions, ask your nurse or doctor.        acetaminophen 160 MG/5ML solution Commonly known as: TYLENOL Take 240 mg by mouth every 6 (six) hours as  needed for fever.   Amantadine HCl 100 MG tablet Take 1/2 (one-half) tablet by mouth twice daily   atorvastatin 40 MG tablet Commonly known as: LIPITOR Take 1 tablet (40 mg total) by mouth daily. For cholesterol   cloNIDine HCl 0.1 MG Tb12 ER tablet Commonly known as: KAPVAY TAKE 1 TABLET BY MOUTH IN THE MORNING AND 1 IN THE EVENING AND 2 AT BEDTIME   diazepam 2 MG tablet Commonly known as: VALIUM GIVE 1 TABLET BY MOUTH AT BEDTIME   famotidine 20 MG tablet Commonly known as: Pepcid Take 1 tablet (20 mg total) by mouth 2 (two) times daily.   fluocinonide-emollient 0.05 % cream Commonly known as: LIDEX-E Apply 1 application topically 2 (two) times daily. To affected areas   hydrOXYzine 25 MG tablet Commonly known as: ATARAX/VISTARIL Take 1 tablet by mouth three times daily as needed   levothyroxine 50 MCG tablet Commonly known as: Euthyrox TAKE 1 TABLET BY MOUTH ONCE DAILY NEEDS  LAB  WORK What changed: Another medication with the same name was removed. Continue taking this medication, and follow the directions you see here. Changed by: Claretta Fraise, MD   polyethylene glycol powder 17 GM/SCOOP powder Commonly known as: GLYCOLAX/MIRALAX TAKE 17 GRAMS BY MOUTH DAILY AS NEEDED FOR MODERATE CONSTIPATION   tiZANidine 2 MG tablet Commonly known as: ZANAFLEX Take 1 tablet (2 mg total) by mouth at bedtime. TAKE 1 TABLET BY MOUTH AT BEDTIME   triamcinolone cream 0.1 % Commonly known as: KENALOG Apply 1 application topically 3 (three) times daily. Avoid face and genitalia   Vitamin D (Ergocalciferol) 1.25 MG (50000 UNIT) Caps capsule Commonly known as: DRISDOL Take 1 capsule (50,000 Units total) by mouth every 7 (seven) days.   zonisamide 100 MG capsule Commonly known as: ZONEGRAN Take 2 capsules (200 mg total) by mouth at bedtime.      Due to the heroic efforts of her caregiver and guardian, the patient appears to be thriving.  Her albumin level is good as is her total  protein.  This is undoubtedly due to efforts to maintain nutrition through liquid supplements such as boost in addition to careful dietary management by her caregiver.  Of note is that her thyroid blood tests are normal.  She will be tried off of  her thyroid medicine for this next interval and monitor for change in her test results and her overall condition in order to determine if she still needs the medication.  We will also hold the amantadine and monitor for change of condition.  Additionally monitor sodium closely.  Follow-up: Return in about 6 months (around 02/29/2020), or if symptoms worsen or fail to improve.  Claretta Fraise, M.D.

## 2019-10-22 ENCOUNTER — Encounter (INDEPENDENT_AMBULATORY_CARE_PROVIDER_SITE_OTHER): Payer: Self-pay | Admitting: Family

## 2019-10-29 ENCOUNTER — Other Ambulatory Visit: Payer: Self-pay | Admitting: Family Medicine

## 2019-11-05 ENCOUNTER — Ambulatory Visit (INDEPENDENT_AMBULATORY_CARE_PROVIDER_SITE_OTHER): Payer: Medicaid Other | Admitting: Family

## 2019-11-05 DIAGNOSIS — G40309 Generalized idiopathic epilepsy and epileptic syndromes, not intractable, without status epilepticus: Secondary | ICD-10-CM

## 2019-11-05 DIAGNOSIS — G4700 Insomnia, unspecified: Secondary | ICD-10-CM

## 2019-11-05 DIAGNOSIS — G809 Cerebral palsy, unspecified: Secondary | ICD-10-CM

## 2019-11-05 DIAGNOSIS — H47033 Optic nerve hypoplasia, bilateral: Secondary | ICD-10-CM

## 2019-11-05 DIAGNOSIS — G801 Spastic diplegic cerebral palsy: Secondary | ICD-10-CM

## 2019-11-05 DIAGNOSIS — F72 Severe intellectual disabilities: Secondary | ICD-10-CM

## 2019-11-05 MED ORDER — ZONISAMIDE 100 MG PO CAPS: 200.0000 mg | ORAL_CAPSULE | Freq: Every day | ORAL | 5 refills | Status: DC

## 2019-11-05 MED ORDER — HYDROXYZINE HCL 25 MG PO TABS: 25.0000 mg | ORAL_TABLET | Freq: Three times a day (TID) | ORAL | 5 refills | Status: DC | PRN

## 2019-11-05 MED ORDER — AMANTADINE HCL 100 MG PO TABS: ORAL_TABLET | ORAL | 3 refills | Status: DC

## 2019-11-05 MED ORDER — TIZANIDINE HCL 2 MG PO TABS: 2.0000 mg | ORAL_TABLET | Freq: Every day | ORAL | 3 refills | Status: DC

## 2019-11-05 MED ORDER — CLONIDINE HCL ER 0.1 MG PO TB12: ORAL_TABLET | ORAL | 3 refills | Status: DC

## 2019-11-05 MED ORDER — ALPRAZOLAM 0.25 MG PO TABS: ORAL_TABLET | ORAL | 0 refills | Status: DC

## 2019-11-05 MED ORDER — DIAZEPAM 2 MG PO TABS: ORAL_TABLET | ORAL | 3 refills | Status: DC

## 2019-11-05 NOTE — Progress Notes (Signed)
This is a Pediatric Specialist E-Visit follow up consult provided via Telephone Abigail Hartman and their parent/guardian Abigail Hartman consented to an E-Visit consult today.  Location of patient: Abigail [facility] is at [facility] Location of provider: Rockwell Germany, NP is in office Patient was referred by Claretta Fraise, MD   The following participants were involved in this E-Visit: patient, mother, CMA, provider  Chief Complain/ Reason for E-Visit today: follow up Total time on call: 15 min Follow up: 6 months     Abigail Hartman   MRN:  [id number]  [birth year]   Provider: Rockwell Germany NP-C Location of Care: [facility]  Visit type: Telephone visit  Last visit: 06/05/2019  Referral source: Redge Gainer, MD History from: mother, patient, and chcn chart  Brief history:  Copied from previous record: 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 She does not have pituitary dysfunction. She has hypothyroidism that is managed by her PCP. She has generalized tonic-clonic seizures. She is taking and tolerating Zonisamide for her seizure disorder and has remained seizure free since September 2016. She takes Amantadine and Tizanidine for spasticity and movement, Clonidine ER for behavior and Hydroxyzine for sleep. She can be aggressive at times, pinching or pulling hair, particularly when she is frustrated or anxious. She is in the care of her grandparents.  Today's concerns: Grandmother reports today that [place] has remained seizure free since her last visit. She has ongoing spasticity but grandmother feels that it has not increased and that Abigail Hartman does not appear to be experiencing pain.   Grandmother is concerned today that Abigail Hartman has ongoing problems with sleep, as well as intermittent  bouts of irritability and aggressive behavior. She says that despite the medications given to her at night, Abigail Hartman either remains awake or she goes to sleep for an hour or so and then remains awake for the remainder of the night. She is very active and can become agitated when she has long periods of no sleep. When she does sleep at night, even for 5 or 6 hours, her behavior is better. Abigail Hartman does not appear to be uncomfortable when she is awake during the night but is simply active and moving about. She does not nap during the day. This pattern is exhausting to her family.   Grandmother also asked today about a prescription for Alprazolam for an upcoming family trip. She says that Abigail Hartman gets more agitated when traveling. Abigail Hartman has received Alprazolam in the past for travel and it worked well to reduce her agitation in the car and in the new environment.   Abigail Hartman has been otherwise generally healthy since she was last seen. Grandmother has no other health concerns for Abigail Hartman today other than previously mentioned.  Review of systems: Please see HPI for neurologic and other pertinent review of systems. Otherwise all other systems were reviewed and were negative.  Problem List: Patient Active Problem List   Diagnosis Date Noted  . Spastic diplegia ([place]) 12/03/2014  . 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 Insomnia 12/03/2014  . Cerebral palsy ([place]) 05/07/2014  . Hypoxic ischemic encephalopathy, neonatal onset, severe 04/26/2014  . Optic nerve hypoplasia of both eyes 04/26/2014  . Blindness 04/26/2014  . Severe intellectual disability 04/26/2014  . GERD (gastroesophageal reflux disease) 07/18/2012  . Hypothyroidism 07/18/2012     Past Medical History:  Diagnosis  Date  . Blind   . Developmental delay, severe   . Hypothyroidism   . Seizures ([place])     Past medical history comments: See HPI Copied from previous record: Her last seizure occurred on December 18, 2014; prior to that 2011;  seizures were generalized tonic-clonic in nature.  She has problems with insomnia and sometimes will be awake for three or four days straight and then sleep.  I reviewed her last evaluation at [facility]. Her last video EEG was poorly organized and showed delta and beta range activity. There were four push button events with no EEG seizure correlates and she had frequent eye movement artifact.  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  In the past, she had numerous hospitalizations for recurrent seizures, pneumonia, and at one point had a problem with aspiration pneumonia and a gastrostomy tube was placed for about one to two years. It was removed about two years ago.  She is followed by [facility] at [facility] for endocrinology and also by pediatric nephrology at [facility]. She has also been followed at [facility] by orthopedic surgery and physical therapy.  Surgical history: Past Surgical History:  Procedure Laterality Date  . GI TUBE  2011     Family history: family history is not on file.   Social history: Social History   Socioeconomic History  . Marital status: Single    Spouse name: Not on file  . Number of children: Not on file  . Years of education: Not on file  . Highest education level: Not on file  Occupational History  . Not on file  Tobacco Use  . Smoking status: Passive Smoke Exposure - Never Smoker  . Smokeless tobacco: Never Used  Substance and Sexual Activity  . Alcohol use: No    Alcohol/week: 0.0 standard drinks  . Drug use: No  . Sexual activity: Never  Other Topics Concern  . Not on file  Social History Narrative   Abigail Hartman is a 24 yo female.   She graduated from [employer].   She lives with both parents.   She enjoys listening to music.   Social Determinants of Health   Financial Resource Strain:   .  Difficulty of Paying Living Expenses:   Food Insecurity:   . Worried About Charity fundraiser in the Last Year:   . Arboriculturist in the Last Year:   Transportation Needs:   . Film/video editor (Medical):   Marland Kitchen Lack of Transportation (Non-Medical):   Physical Activity:   . Days of Exercise per Week:   . Minutes of Exercise per Session:   Stress:   . Feeling of Stress :   Social Connections:   . Frequency of Communication with Friends and Family:   . Frequency of Social Gatherings with Friends and Family:   . Attends Religious Services:   . Active Member of Clubs or Organizations:   . Attends Archivist Meetings:   Marland Kitchen Marital Status:   Intimate Partner Violence:   . Fear of Current or Ex-Partner:   . Emotionally Abused:   Marland Kitchen Physically Abused:   . Sexually Abused:     Past/failed meds:  Allergies: Allergies  Allergen Reactions  . Motrin [Ibuprofen] Rash     Immunizations: Immunization History  Administered Date(s) Administered  . DTaP 10/19/1995, 12/21/1995, 02/29/1996, 04/25/2000  . Hepatitis B, ped/adol  10/19/1995  . HiB (PRP-T) 10/19/1995, 12/21/1995, 02/29/1996  . IPV 10/19/1995, 12/21/1995, 08/29/1996, 04/25/2000  . Influenza, Seasonal, Injecte, Preservative Fre 12/29/2009, 04/27/2010, 04/13/2011  . Influenza,inj,Quad PF,6+ Mos 03/06/2014, 01/14/2015, 03/15/2016  . Janssen (J&J) SARS-COV-2 Vaccination 07/06/2019  . MMR 08/29/1996, 04/25/2000  . Pneumococcal Conjugate-13 06/05/1999  . Tdap 11/28/2007  . Varicella 08/29/1996     Diagnostics/Screenings:  Physical Exam: Wt 73 lb (33.1 kg)   BMI 22.28 kg/m   There was no physical exam as this was a telephone visit  Impression: 1. Generalized convulsive epilepsy 2. Insomnia 3. Bilateral optic nerve hypoplasia 4. 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 Severe intellectual disability 6. Intermittent aggressive behavior  Recommendations for plan of care: The patient's  previous [facility] records were reviewed. Abigail Hartman has neither had nor required imaging or lab studies since the last visit. She is a 24 year old young woman with history of 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 She has generalized tonic-clonic seizures and has remained seizure free on Zonisamide since September 2016. She takes Amantadine and Tizanidine for spasticity and movement, Clonidine ER for behavior. She takes Hydroxyzine and Diazepam for sleep. Grandmother reports that Abigail Hartman's insomnia is problematic and I talked with her about sleep hygiene measures. I also recommended increasing the Diazepam 60m to 2 tablets at bedtime, and asked grandmother to let me know in 1 week if this is not helpful.   Sayra's [place] be [place] this [place] and grandmother is concerned about agitation that typically occurs when Abigail Hartman is in the car. She has taken and tolerated Alprazolam in the past, which worked well to reduce her agitation. I will send in a prescription for her to use on the upcoming trip.   The medication list was reviewed and reconciled. I reviewed changes that were made in the prescribed medications today. A complete medication list was provided to the patient.  Allergies as of 11/05/2019      Reactions   Motrin [ibuprofen] Rash      Medication List       Accurate as of November 05, 2019 11:59 PM. If you have any questions, ask your nurse or doctor.        acetaminophen 160 MG/5ML solution Commonly known as: TYLENOL Take 240 mg by mouth every 6 (six) hours as needed for fever.   ALPRAZolam 0.25 MG tablet Commonly known as: Xanax Give 1 tablet up to 3 times per day as needed for agitation   Amantadine HCl 100 MG tablet Take 1/2 (one-half) tablet by mouth twice daily     atorvastatin 40 MG tablet Commonly known as: LIPITOR TAKE 1 TABLET BY MOUTH ONCE DAILY FOR CHOLESTEROL   cloNIDine HCl 0.1 MG Tb12 ER tablet Commonly known as: KAPVAY TAKE 1 TABLET BY MOUTH IN THE MORNING AND 1 IN THE EVENING AND 2 AT BEDTIME   diazepam 2 MG tablet Commonly known as: VALIUM GIVE 2 TABLETS BY MOUTH AT BEDTIME What changed: additional instructions   famotidine 20 MG tablet Commonly known as: Pepcid Take 1 tablet (20 mg total) by mouth 2 (two) times daily.   fluocinonide-emollient 0.05 % cream Commonly known as: LIDEX-E Apply 1 application topically 2 (two) times daily. To affected areas   hydrOXYzine 25 MG tablet Commonly known as: ATARAX/VISTARIL Take 1 tablet (25 mg total) by  mouth 3 (three) times daily as needed.   levothyroxine 50 MCG tablet Commonly known as: Euthyrox TAKE 1 TABLET BY MOUTH ONCE DAILY   polyethylene glycol powder 17 GM/SCOOP powder Commonly known as: GLYCOLAX/MIRALAX TAKE 17 GRAMS BY MOUTH DAILY AS NEEDED FOR MODERATE CONSTIPATION   tiZANidine 2 MG tablet Commonly known as: ZANAFLEX Take 1 tablet (2 mg total) by mouth at bedtime. TAKE 1 TABLET BY MOUTH AT BEDTIME   triamcinolone cream 0.1 % Commonly known as: KENALOG Apply 1 application topically 3 (three) times daily. Avoid face and genitalia What changed:   when to take this  reasons to take this   zonisamide 100 MG capsule Commonly known as: ZONEGRAN Take 2 capsules (200 mg total) by mouth at bedtime.       Total time spent with grandmother on the phone was 15 minutes, of which 50% or more was spent in counseling and coordination of care.  Abigail Germany NP-C [facility] Ph. [phone] Fax [phone]

## 2019-11-05 NOTE — Patient Instructions (Signed)
Thank you for talking with me by phone today.   Instructions for you until your next appointment are as follows: 1. Increase the Diazepam 2mg  to 2 tablets at bedtime. If [place] is not sleeping better in a week, please let me know.  2. I will send in generic Xanax 0.25mg  for your upcoming trip to . Give Sheral 1 tablet up to 3 times per day as needed for agitation while traveling 3. Continue Tyna's other medications as prescribed.  4. Let me know if Jillayne has any seizures or if you have any concerns 5. Please sign up for MyChart if you have not done so 6. Please plan to return for follow up in 6 months or sooner if needed.

## 2019-11-06 ENCOUNTER — Encounter (INDEPENDENT_AMBULATORY_CARE_PROVIDER_SITE_OTHER): Payer: Self-pay | Admitting: Family

## 2019-11-23 ENCOUNTER — Other Ambulatory Visit: Payer: Self-pay | Admitting: Family Medicine

## 2019-12-06 ENCOUNTER — Ambulatory Visit (INDEPENDENT_AMBULATORY_CARE_PROVIDER_SITE_OTHER): Payer: Medicaid Other | Admitting: Family

## 2019-12-10 ENCOUNTER — Telehealth (INDEPENDENT_AMBULATORY_CARE_PROVIDER_SITE_OTHER): Payer: Self-pay | Admitting: Family

## 2019-12-10 ENCOUNTER — Other Ambulatory Visit (INDEPENDENT_AMBULATORY_CARE_PROVIDER_SITE_OTHER): Payer: Self-pay | Admitting: Family

## 2019-12-10 DIAGNOSIS — F72 Severe intellectual disabilities: Secondary | ICD-10-CM

## 2019-12-10 NOTE — Telephone Encounter (Signed)
Spoke to mom and let her know that I confirmed with the pharmacy they do have her refills for tizanidine. They had to order it and it would be ready tomorrow. Forwarded this message to Inetta Fermo so she is aware the medicine is working well.

## 2019-12-10 NOTE — Telephone Encounter (Signed)
Noted. Refill sent. TG

## 2019-12-10 NOTE — Telephone Encounter (Signed)
Who's calling (name and relationship to patient) : Abigail Hartman mom   Best contact number: [phone]  Provider they see: Elveria Rising  Reason for call: Mom states that the new medication are working well for patient  Tizanidine needs to be refilled as well.   Call ID:      PRESCRIPTION REFILL ONLY  Name of prescription: Tizanidine  [facility]: [facility] [place]

## 2020-03-27 ENCOUNTER — Other Ambulatory Visit (INDEPENDENT_AMBULATORY_CARE_PROVIDER_SITE_OTHER): Payer: Self-pay | Admitting: Family

## 2020-03-27 ENCOUNTER — Other Ambulatory Visit: Payer: Self-pay | Admitting: Family Medicine

## 2020-03-27 DIAGNOSIS — F72 Severe intellectual disabilities: Secondary | ICD-10-CM

## 2020-03-27 DIAGNOSIS — K219 Gastro-esophageal reflux disease without esophagitis: Secondary | ICD-10-CM

## 2020-04-07 ENCOUNTER — Telehealth (INDEPENDENT_AMBULATORY_CARE_PROVIDER_SITE_OTHER): Payer: Self-pay | Admitting: Family
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
## 2020-04-07 NOTE — Telephone Encounter (Signed)
I called [facility] and approved early refills. TG

## 2020-04-08 ENCOUNTER — Telehealth: Payer: Self-pay

## 2020-04-08 MED ORDER — LEVOTHYROXINE SODIUM 50 MCG PO TABS: 50.0000 ug | ORAL_TABLET | Freq: Every day | ORAL | 5 refills | Status: DC

## 2020-04-08 MED ORDER — ATORVASTATIN CALCIUM 40 MG PO TABS: 40.0000 mg | ORAL_TABLET | Freq: Every day | ORAL | 0 refills | Status: DC

## 2020-04-08 NOTE — Telephone Encounter (Signed)
LMOVM refilled Atorvastatin & thyroid med to pharmacy. Please call back to make 6 mos ckup

## 2020-05-04 ENCOUNTER — Other Ambulatory Visit (INDEPENDENT_AMBULATORY_CARE_PROVIDER_SITE_OTHER): Payer: Self-pay | Admitting: Family

## 2020-05-04 ENCOUNTER — Other Ambulatory Visit: Payer: Self-pay | Admitting: Family Medicine

## 2020-05-04 DIAGNOSIS — G4700 Insomnia, unspecified: Secondary | ICD-10-CM

## 2020-05-05 NOTE — Telephone Encounter (Signed)
Attempted to contact - NA 

## 2020-05-05 NOTE — Telephone Encounter (Signed)
Stacks. NTBS 30 days given 04/07/20

## 2020-05-13 ENCOUNTER — Other Ambulatory Visit (INDEPENDENT_AMBULATORY_CARE_PROVIDER_SITE_OTHER): Payer: Self-pay | Admitting: Family

## 2020-05-13 DIAGNOSIS — F72 Severe intellectual disabilities: Secondary | ICD-10-CM

## 2020-05-13 DIAGNOSIS — G4700 Insomnia, unspecified: Secondary | ICD-10-CM

## 2020-05-14 NOTE — Telephone Encounter (Signed)
Please advise 

## 2020-06-03 ENCOUNTER — Other Ambulatory Visit: Payer: Self-pay | Admitting: Family Medicine

## 2020-06-03 ENCOUNTER — Other Ambulatory Visit (INDEPENDENT_AMBULATORY_CARE_PROVIDER_SITE_OTHER): Payer: Self-pay | Admitting: Family

## 2020-06-03 DIAGNOSIS — G4700 Insomnia, unspecified: Secondary | ICD-10-CM

## 2020-06-03 DIAGNOSIS — F72 Severe intellectual disabilities: Secondary | ICD-10-CM

## 2020-06-03 DIAGNOSIS — G40309 Generalized idiopathic epilepsy and epileptic syndromes, not intractable, without status epilepticus: Secondary | ICD-10-CM

## 2020-06-04 NOTE — Telephone Encounter (Signed)
Please advise 

## 2020-06-05 ENCOUNTER — Other Ambulatory Visit: Payer: Self-pay | Admitting: Family Medicine

## 2020-06-12 ENCOUNTER — Telehealth: Payer: Self-pay

## 2020-06-12 MED ORDER — ATORVASTATIN CALCIUM 40 MG PO TABS: 40.0000 mg | ORAL_TABLET | Freq: Every day | ORAL | 0 refills | Status: DC
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
## 2020-06-12 NOTE — Telephone Encounter (Signed)
Aware refill sent to pharmacy for 30 days & pt needs appt within this time since she has not been seen since last June

## 2020-06-17 ENCOUNTER — Telehealth (INDEPENDENT_AMBULATORY_CARE_PROVIDER_SITE_OTHER): Payer: Medicaid Other | Admitting: Family

## 2020-06-19 ENCOUNTER — Other Ambulatory Visit (INDEPENDENT_AMBULATORY_CARE_PROVIDER_SITE_OTHER): Payer: Self-pay | Admitting: Family

## 2020-06-19 ENCOUNTER — Other Ambulatory Visit: Payer: Self-pay | Admitting: Family Medicine

## 2020-06-19 DIAGNOSIS — G4700 Insomnia, unspecified: Secondary | ICD-10-CM

## 2020-06-19 DIAGNOSIS — G40309 Generalized idiopathic epilepsy and epileptic syndromes, not intractable, without status epilepticus: Secondary | ICD-10-CM

## 2020-06-19 DIAGNOSIS — F72 Severe intellectual disabilities: Secondary | ICD-10-CM

## 2020-07-02 ENCOUNTER — Other Ambulatory Visit: Payer: Self-pay | Admitting: Family Medicine

## 2020-07-02 ENCOUNTER — Other Ambulatory Visit (INDEPENDENT_AMBULATORY_CARE_PROVIDER_SITE_OTHER): Payer: Self-pay | Admitting: Family

## 2020-07-02 DIAGNOSIS — F72 Severe intellectual disabilities: Secondary | ICD-10-CM

## 2020-07-02 DIAGNOSIS — G4700 Insomnia, unspecified: Secondary | ICD-10-CM

## 2020-07-02 DIAGNOSIS — G40309 Generalized idiopathic epilepsy and epileptic syndromes, not intractable, without status epilepticus: Secondary | ICD-10-CM

## 2020-07-03 NOTE — Telephone Encounter (Signed)
Please escribe

## 2020-07-07 ENCOUNTER — Telehealth (INDEPENDENT_AMBULATORY_CARE_PROVIDER_SITE_OTHER): Payer: Medicaid Other | Admitting: Family

## 2020-07-07 ENCOUNTER — Encounter (INDEPENDENT_AMBULATORY_CARE_PROVIDER_SITE_OTHER): Payer: Self-pay | Admitting: Family

## 2020-07-07 VITALS — Ht <= 58 in | Wt 71.0 lb

## 2020-07-07 DIAGNOSIS — G809 Cerebral palsy, unspecified: Secondary | ICD-10-CM

## 2020-07-07 DIAGNOSIS — G801 Spastic diplegic cerebral palsy: Secondary | ICD-10-CM | POA: Diagnosis not present

## 2020-07-07 DIAGNOSIS — G40309 Generalized idiopathic epilepsy and epileptic syndromes, not intractable, without status epilepticus: Secondary | ICD-10-CM

## 2020-07-07 DIAGNOSIS — F72 Severe intellectual disabilities: Secondary | ICD-10-CM

## 2020-07-07 DIAGNOSIS — G4700 Insomnia, unspecified: Secondary | ICD-10-CM | POA: Diagnosis not present

## 2020-07-07 DIAGNOSIS — K219 Gastro-esophageal reflux disease without esophagitis: Secondary | ICD-10-CM
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
## 2020-07-09 NOTE — Patient Instructions (Signed)
Thank you for meeting with me by video today.   Instructions for you until your next appointment are as follows: 1. Continue Abigail Hartman's medications are prescribed 2. Let me know if you have any questions or concerns 3. Please sign up for MyChart if you have not done so. 4. Please plan to return for follow up in 6 months or sooner if needed.  At [facility], we are committed to providing exceptional care. You will receive a patient satisfaction survey through text or email regarding your visit today. Your opinion is important to me. Comments are appreciated.

## 2020-07-15 ENCOUNTER — Other Ambulatory Visit: Payer: Self-pay | Admitting: Family Medicine

## 2020-07-20 ENCOUNTER — Encounter (INDEPENDENT_AMBULATORY_CARE_PROVIDER_SITE_OTHER): Payer: Self-pay | Admitting: Family

## 2020-07-20 MED ORDER — TIZANIDINE HCL 2 MG PO TABS: 2.0000 mg | ORAL_TABLET | Freq: Every day | ORAL | 3 refills | Status: DC

## 2020-07-20 MED ORDER — CLONIDINE HCL ER 0.1 MG PO TB12
ORAL_TABLET | ORAL | 3 refills | Status: DC
Start: 2020-07-20 — End: 2021-07-06

## 2020-07-20 MED ORDER — DIAZEPAM 2 MG PO TABS: ORAL_TABLET | ORAL | 5 refills | Status: DC

## 2020-07-20 MED ORDER — ALPRAZOLAM 0.25 MG PO TABS: ORAL_TABLET | ORAL | 5 refills | Status: DC

## 2020-07-20 MED ORDER — AMANTADINE HCL 100 MG PO TABS: ORAL_TABLET | ORAL | 3 refills | Status: DC

## 2020-07-20 MED ORDER — ZONISAMIDE 100 MG PO CAPS
200.0000 mg | ORAL_CAPSULE | Freq: Every day | ORAL | 5 refills | Status: DC
Start: 2020-07-20 — End: 2020-12-29

## 2020-07-20 MED ORDER — HYDROXYZINE HCL 25 MG PO TABS: 25.0000 mg | ORAL_TABLET | Freq: Three times a day (TID) | ORAL | 5 refills | Status: DC | PRN

## 2020-07-20 MED ORDER — FAMOTIDINE 20 MG PO TABS: 20.0000 mg | ORAL_TABLET | Freq: Two times a day (BID) | ORAL | 3 refills | Status: DC

## 2020-07-30 ENCOUNTER — Other Ambulatory Visit: Payer: Self-pay | Admitting: *Deleted

## 2020-07-30 MED ORDER — ATORVASTATIN CALCIUM 40 MG PO TABS: 40.0000 mg | ORAL_TABLET | Freq: Every day | ORAL | 0 refills | Status: DC
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
## 2020-08-03 ENCOUNTER — Encounter (INDEPENDENT_AMBULATORY_CARE_PROVIDER_SITE_OTHER): Payer: Self-pay

## 2020-08-05 ENCOUNTER — Other Ambulatory Visit (INDEPENDENT_AMBULATORY_CARE_PROVIDER_SITE_OTHER): Payer: Self-pay | Admitting: Family

## 2020-08-05 DIAGNOSIS — G4700 Insomnia, unspecified: Secondary | ICD-10-CM

## 2020-08-05 DIAGNOSIS — F72 Severe intellectual disabilities: Secondary | ICD-10-CM

## 2020-08-06 NOTE — Telephone Encounter (Signed)
Please escribe

## 2020-08-30 ENCOUNTER — Other Ambulatory Visit: Payer: Self-pay | Admitting: Family Medicine

## 2020-09-29 ENCOUNTER — Other Ambulatory Visit (INDEPENDENT_AMBULATORY_CARE_PROVIDER_SITE_OTHER): Payer: Self-pay | Admitting: Family

## 2020-09-29 DIAGNOSIS — K219 Gastro-esophageal reflux disease without esophagitis: Secondary | ICD-10-CM

## 2020-10-02 ENCOUNTER — Ambulatory Visit: Payer: Medicaid Other | Admitting: Family Medicine

## 2020-10-09 ENCOUNTER — Other Ambulatory Visit: Payer: Self-pay | Admitting: Family Medicine

## 2020-10-28 ENCOUNTER — Encounter: Payer: Self-pay | Admitting: Family Medicine

## 2020-10-28 ENCOUNTER — Ambulatory Visit (INDEPENDENT_AMBULATORY_CARE_PROVIDER_SITE_OTHER): Payer: Medicaid Other | Admitting: Family Medicine

## 2020-10-28 ENCOUNTER — Ambulatory Visit: Payer: Medicaid Other | Admitting: Nurse Practitioner

## 2020-10-28 ENCOUNTER — Other Ambulatory Visit: Payer: Self-pay

## 2020-10-28 VITALS — BP 111/78 | HR 92 | Temp 97.8°F

## 2020-10-28 DIAGNOSIS — E038 Other specified hypothyroidism: Secondary | ICD-10-CM | POA: Diagnosis not present

## 2020-10-28 DIAGNOSIS — G809 Cerebral palsy, unspecified: Secondary | ICD-10-CM

## 2020-10-28 DIAGNOSIS — E7800 Pure hypercholesterolemia, unspecified: Secondary | ICD-10-CM

## 2020-10-28 MED ORDER — LEVOTHYROXINE SODIUM 50 MCG PO TABS: 50.0000 ug | ORAL_TABLET | Freq: Every day | ORAL | 3 refills | Status: DC

## 2020-10-28 MED ORDER — ATORVASTATIN CALCIUM 40 MG PO TABS: 40.0000 mg | ORAL_TABLET | Freq: Every day | ORAL | 3 refills | Status: DC

## 2020-10-28 NOTE — Progress Notes (Signed)
Follow up of her CP,   Subjective:  Patient ID: Abigail [facility], female    DOB: June 22, 1995  Age: 25 y.o. MRN: [id number]  CC: Medical Management of Chronic Issues   HPI Abigail [facility]man presents for follow up of her CP, thyroid and cholesterol. [name] of Neurology follows her for CP - related seizures. She is unable to speak.   Depression screen [facility] 2/9 10/28/2020 08/30/2019 11/15/2018  Decreased Interest 0 0 0  Down, Depressed, Hopeless 0 0 0  PHQ - 2 Score 0 0 0    History Abigail Hartman has a past medical history of Blind, Developmental delay, severe, Hypothyroidism, and Seizures ([place]).   She has a past surgical history that includes GI TUBE (2011).   Her family history is not on file.She reports that she is a non-smoker but has been exposed to tobacco smoke. She has never used smokeless tobacco. She reports that she does not drink alcohol and does not use drugs.    ROS Review of Systems  Unable to perform ROS: Patient nonverbal   Objective:  BP 111/78   Pulse 92   Temp 97.8 F (36.6 C)   SpO2 97%   BP Readings from Last 3 Encounters:  10/28/20 111/78  08/30/19 94/67  11/15/18 98/66    Wt Readings from Last 3 Encounters:  07/07/20 71 lb (32.2 kg)  11/05/19 73 lb (33.1 kg)  08/30/19 70 lb (31.8 kg)     Physical Exam Constitutional:      General: She is not in acute distress.    Appearance: She is well-developed.  HENT:     Head: Normocephalic and atraumatic.  Eyes:     Conjunctiva/sclera: Conjunctivae normal.     Pupils: Pupils are equal, round, and reactive to light.  Neck:     Thyroid: No thyromegaly.  Cardiovascular:     Rate and Rhythm: Normal rate and regular rhythm.     Heart sounds: Normal heart sounds. No murmur heard. Pulmonary:     Effort: Pulmonary effort is normal. No respiratory distress.     Breath sounds: Normal breath sounds. No wheezing or rales.  Abdominal:     General: Bowel sounds are normal. There is no distension.     Palpations: Abdomen  is soft.     Tenderness: There is no abdominal tenderness.  Musculoskeletal:     Cervical back: Normal range of motion and neck supple.  Lymphadenopathy:     Cervical: No cervical adenopathy.  Skin:    General: Skin is warm and dry.  Neurological:     Mental Status: She is alert.  Psychiatric:        Behavior: Behavior normal.      Assessment & Plan:   Abigail Hartman was seen today for medical management of chronic issues.  Diagnoses and all orders for this visit:  Cerebral palsy, unspecified type ([place]) -     CBC with Differential/Platelet -     CMP14+EGFR -     Lipid panel -     TSH + free T4  Other specified hypothyroidism -     CBC with Differential/Platelet -     CMP14+EGFR -     Lipid panel -     TSH + free T4  Elevated cholesterol -     CBC with Differential/Platelet -     CMP14+EGFR -     Lipid panel -     TSH + free T4  Other orders -     atorvastatin (LIPITOR) 40 MG  tablet; Take 1 tablet (40 mg total) by mouth daily. -     levothyroxine (EUTHYROX) 50 MCG tablet; Take 1 tablet (50 mcg total) by mouth daily.      I have discontinued Carsen Stansbury's triamcinolone cream. I have changed her Euthyrox to levothyroxine. I have also changed her atorvastatin. Additionally, I am having her maintain her acetaminophen, fluocinonide-emollient, polyethylene glycol powder, zonisamide, Amantadine HCl, cloNIDine HCl, ALPRAZolam, hydrOXYzine, tiZANidine, diazepam, and famotidine.  Allergies as of 10/28/2020       Reactions   Motrin [ibuprofen] Rash        Medication List        Accurate as of October 28, 2020  3:44 PM. If you have any questions, ask your nurse or doctor.          STOP taking these medications    triamcinolone cream 0.1 % Commonly known as: KENALOG Stopped by: Claretta Fraise, MD       TAKE these medications    acetaminophen 160 MG/5ML solution Commonly known as: TYLENOL Take 240 mg by mouth every 6 (six) hours as needed for fever.   ALPRAZolam  0.25 MG tablet Commonly known as: XANAX TAKE 1 TABLET BY MOUTH UP TO THREE TIMES DAILY AS NEEDED FOR AGITATION   Amantadine HCl 100 MG tablet Take 1/2 (one-half) tablet by mouth twice daily   atorvastatin 40 MG tablet Commonly known as: LIPITOR Take 1 tablet (40 mg total) by mouth daily.   cloNIDine HCl 0.1 MG Tb12 ER tablet Commonly known as: KAPVAY TAKE 1 TABLET BY MOUTH IN THE MORNING AND 1 IN THE EVENING AND 2 AT BEDTIME   diazepam 2 MG tablet Commonly known as: VALIUM GIVE 2 TABLETS BY MOUTH AT BEDTIME   famotidine 20 MG tablet Commonly known as: PEPCID Take 1 tablet by mouth twice daily   fluocinonide-emollient 0.05 % cream Commonly known as: LIDEX-E Apply 1 application topically 2 (two) times daily. To affected areas   hydrOXYzine 25 MG tablet Commonly known as: ATARAX/VISTARIL Take 1 tablet (25 mg total) by mouth 3 (three) times daily as needed.   levothyroxine 50 MCG tablet Commonly known as: Euthyrox Take 1 tablet (50 mcg total) by mouth daily. What changed: how much to take Changed by: Claretta Fraise, MD   polyethylene glycol powder 17 GM/SCOOP powder Commonly known as: GLYCOLAX/MIRALAX TAKE 17 GRAMS BY MOUTH DAILY AS NEEDED FOR MODERATE CONSTIPATION   tiZANidine 2 MG tablet Commonly known as: ZANAFLEX Take 1 tablet (2 mg total) by mouth at bedtime. TAKE 1 TABLET BY MOUTH AT BEDTIME   zonisamide 100 MG capsule Commonly known as: ZONEGRAN Take 2 capsules (200 mg total) by mouth at bedtime.         Follow-up: Return in about 1 year (around 10/28/2021).  Claretta Fraise, M.D.

## 2020-10-30 LAB — TSH+FREE T4

## 2020-10-30 LAB — CMP14+EGFR
Chloride: 108 mmol/L — ABNORMAL HIGH (ref 96–106)
Potassium: 4.8 mmol/L (ref 3.5–5.2)
Sodium: 145 mmol/L — ABNORMAL HIGH (ref 134–144)

## 2020-11-06 ENCOUNTER — Encounter: Payer: Self-pay | Admitting: *Deleted

## 2020-12-09 ENCOUNTER — Telehealth (INDEPENDENT_AMBULATORY_CARE_PROVIDER_SITE_OTHER): Payer: Self-pay | Admitting: Family

## 2020-12-09 DIAGNOSIS — K219 Gastro-esophageal reflux disease without esophagitis: Secondary | ICD-10-CM

## 2020-12-22 ENCOUNTER — Telehealth: Payer: Self-pay | Admitting: Family Medicine

## 2020-12-22 NOTE — Telephone Encounter (Signed)
Appointment 09/28 with [facility].

## 2020-12-24 ENCOUNTER — Encounter: Payer: Self-pay | Admitting: Family Medicine

## 2020-12-24 ENCOUNTER — Other Ambulatory Visit: Payer: Self-pay

## 2020-12-24 ENCOUNTER — Ambulatory Visit (INDEPENDENT_AMBULATORY_CARE_PROVIDER_SITE_OTHER): Payer: Medicare Other | Admitting: Family Medicine

## 2020-12-24 VITALS — BP 94/69 | HR 91 | Temp 98.0°F | Wt 71.0 lb

## 2020-12-24 DIAGNOSIS — G801 Spastic diplegic cerebral palsy: Secondary | ICD-10-CM

## 2020-12-24 DIAGNOSIS — Z01818 Encounter for other preprocedural examination: Secondary | ICD-10-CM | POA: Diagnosis not present

## 2020-12-24 DIAGNOSIS — H47033 Optic nerve hypoplasia, bilateral: Secondary | ICD-10-CM | POA: Diagnosis not present

## 2020-12-24 DIAGNOSIS — G809 Cerebral palsy, unspecified: Secondary | ICD-10-CM | POA: Diagnosis not present

## 2020-12-24 DIAGNOSIS — H543 Unqualified visual loss, both eyes: Secondary | ICD-10-CM

## 2020-12-24 NOTE — Progress Notes (Signed)
Clearance for deep cleaning of teeth under anesthesia.   Subjective:  Patient ID: Abigail [facility], female    DOB: 1995/04/28  Age: 25 y.o. MRN: [id number]  CC: Pre-op Exam   HPI Abigail [facility] presents for deep cleaning of teeth under general anesthesia. This is necessary due to cerebral palsy and blindness.  She has lower extremity paralysis. She is unable to communicate verbally. Has random choreoform motion of BUE.   Depression screen [facility] 2/9 10/28/2020 08/30/2019 11/15/2018  Decreased Interest 0 0 0  Down, Depressed, Hopeless 0 0 0  PHQ - 2 Score 0 0 0    History Abigail Hartman has a past medical history of Blind, Developmental delay, severe, Hypothyroidism, and Seizures (HCC).   She has a past surgical history that includes GI TUBE (2011).   Her family history is not on file.She reports that she is a non-smoker but has been exposed to tobacco smoke. She has never used smokeless tobacco. She reports that she does not drink alcohol and does not use drugs.    ROS Review of Systems  Unable to perform ROS: Patient nonverbal   Objective:  BP 94/69   Pulse 91   Temp 98 F (36.7 C)   Wt 71 lb (32.2 kg)   SpO2 96%   BMI 22.60 kg/m   BP Readings from Last 3 Encounters:  12/24/20 94/69  10/28/20 111/78  08/30/19 94/67    Wt Readings from Last 3 Encounters:  12/24/20 71 lb (32.2 kg)  07/07/20 71 lb (32.2 kg)  11/05/19 73 lb (33.1 kg)     Physical Exam Constitutional:      General: She is not in acute distress.    Appearance: She is well-developed.  Cardiovascular:     Rate and Rhythm: Normal rate and regular rhythm.  Pulmonary:     Breath sounds: Normal breath sounds.  Musculoskeletal:        General: No swelling or tenderness.  Skin:    General: Skin is warm and dry.  Neurological:     Mental Status: She is alert. She is disoriented.     Coordination: Coordination abnormal.      Assessment & Plan:   Abigail Hartman was seen today for pre-op exam.  Diagnoses and all orders for  this visit:  Cerebral palsy, unspecified type (HCC)  Blindness of both eyes  Optic nerve hypoplasia of both eyes  Spastic diplegia (HCC)  Pre-operative clearance      I am having Abigail Hartman maintain her acetaminophen, fluocinonide-emollient, polyethylene glycol powder, zonisamide, Amantadine HCl, cloNIDine HCl, ALPRAZolam, hydrOXYzine, tiZANidine, diazepam, atorvastatin, levothyroxine, and famotidine.  Allergies as of 12/24/2020       Reactions   Motrin [ibuprofen] Rash        Medication List        Accurate as of December 24, 2020  9:59 PM. If you have any questions, ask your nurse or doctor.          acetaminophen 160 MG/5ML solution Commonly known as: TYLENOL Take 240 mg by mouth every 6 (six) hours as needed for fever.   ALPRAZolam 0.25 MG tablet Commonly known as: XANAX TAKE 1 TABLET BY MOUTH UP TO THREE TIMES DAILY AS NEEDED FOR AGITATION   Amantadine HCl 100 MG tablet Take 1/2 (one-half) tablet by mouth twice daily   atorvastatin 40 MG tablet Commonly known as: LIPITOR Take 1 tablet (40 mg total) by mouth daily.   cloNIDine HCl 0.1 MG Tb12 ER tablet Commonly known as: KAPVAY TAKE 1  TABLET BY MOUTH IN THE MORNING AND 1 IN THE EVENING AND 2 AT BEDTIME   diazepam 2 MG tablet Commonly known as: VALIUM GIVE 2 TABLETS BY MOUTH AT BEDTIME   famotidine 20 MG tablet Commonly known as: PEPCID Take 1 tablet by mouth twice daily   fluocinonide-emollient 0.05 % cream Commonly known as: LIDEX-E Apply 1 application topically 2 (two) times daily. To affected areas   hydrOXYzine 25 MG tablet Commonly known as: ATARAX/VISTARIL Take 1 tablet (25 mg total) by mouth 3 (three) times daily as needed.   levothyroxine 50 MCG tablet Commonly known as: Euthyrox Take 1 tablet (50 mcg total) by mouth daily.   polyethylene glycol powder 17 GM/SCOOP powder Commonly known as: GLYCOLAX/MIRALAX TAKE 17 GRAMS BY MOUTH DAILY AS NEEDED FOR MODERATE CONSTIPATION    tiZANidine 2 MG tablet Commonly known as: ZANAFLEX Take 1 tablet (2 mg total) by mouth at bedtime. TAKE 1 TABLET BY MOUTH AT BEDTIME   zonisamide 100 MG capsule Commonly known as: ZONEGRAN Take 2 capsules (200 mg total) by mouth at bedtime.       Low risk for above named procedure.  Follow-up: Return in about 6 months (around 06/23/2021).  Mechele Claude, M.D.

## 2020-12-29 ENCOUNTER — Telehealth (INDEPENDENT_AMBULATORY_CARE_PROVIDER_SITE_OTHER): Payer: Medicare Other | Admitting: Family

## 2020-12-29 VITALS — Wt 71.0 lb

## 2020-12-29 DIAGNOSIS — G40309 Generalized idiopathic epilepsy and epileptic syndromes, not intractable, without status epilepticus: Secondary | ICD-10-CM

## 2020-12-29 DIAGNOSIS — F72 Severe intellectual disabilities: Secondary | ICD-10-CM

## 2020-12-29 DIAGNOSIS — G4700 Insomnia, unspecified: Secondary | ICD-10-CM | POA: Diagnosis not present

## 2020-12-29 DIAGNOSIS — G801 Spastic diplegic cerebral palsy: Secondary | ICD-10-CM

## 2020-12-29 MED ORDER — ALPRAZOLAM 0.25 MG PO TABS: ORAL_TABLET | ORAL | 5 refills | Status: DC

## 2020-12-29 MED ORDER — ZONISAMIDE 100 MG PO CAPS: 200.0000 mg | ORAL_CAPSULE | Freq: Every day | ORAL | 5 refills | Status: DC

## 2020-12-29 MED ORDER — HYDROXYZINE HCL 25 MG PO TABS: 25.0000 mg | ORAL_TABLET | Freq: Three times a day (TID) | ORAL | 5 refills | Status: DC | PRN

## 2020-12-29 MED ORDER — TIZANIDINE HCL 2 MG PO TABS: 2.0000 mg | ORAL_TABLET | Freq: Every day | ORAL | 5 refills | Status: DC

## 2020-12-29 NOTE — Patient Instructions (Signed)
Thank you for meeting with me by video today.   Instructions for you until your next appointment are as follows: Continue medications as prescribed I will contact [facility] to see if I can get the Tizanidine covered by insurance Let me know if you have any questions or concerns about Roxi Please sign up for MyChart if you have not done so. Please plan to return for follow up in 6 months or sooner if needed.  At [facility], we are committed to providing exceptional care. You will receive a patient satisfaction survey through text or email regarding your visit today. Your opinion is important to me. Comments are appreciated.
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
## 2021-01-01 ENCOUNTER — Other Ambulatory Visit (INDEPENDENT_AMBULATORY_CARE_PROVIDER_SITE_OTHER): Payer: Self-pay | Admitting: Family

## 2021-01-01 DIAGNOSIS — G4700 Insomnia, unspecified: Secondary | ICD-10-CM

## 2021-01-01 DIAGNOSIS — G809 Cerebral palsy, unspecified: Secondary | ICD-10-CM

## 2021-01-01 DIAGNOSIS — G801 Spastic diplegic cerebral palsy: Secondary | ICD-10-CM

## 2021-01-03 ENCOUNTER — Encounter (INDEPENDENT_AMBULATORY_CARE_PROVIDER_SITE_OTHER): Payer: Self-pay | Admitting: Family

## 2021-03-02 ENCOUNTER — Other Ambulatory Visit (INDEPENDENT_AMBULATORY_CARE_PROVIDER_SITE_OTHER): Payer: Self-pay | Admitting: Family

## 2021-03-02 DIAGNOSIS — F72 Severe intellectual disabilities: Secondary | ICD-10-CM

## 2021-03-02 DIAGNOSIS — G4700 Insomnia, unspecified: Secondary | ICD-10-CM

## 2021-03-13 ENCOUNTER — Other Ambulatory Visit (INDEPENDENT_AMBULATORY_CARE_PROVIDER_SITE_OTHER): Payer: Self-pay | Admitting: Family

## 2021-03-13 DIAGNOSIS — G4700 Insomnia, unspecified: Secondary | ICD-10-CM

## 2021-04-02 ENCOUNTER — Other Ambulatory Visit (INDEPENDENT_AMBULATORY_CARE_PROVIDER_SITE_OTHER): Payer: Self-pay | Admitting: Family

## 2021-04-02 DIAGNOSIS — G809 Cerebral palsy, unspecified: Secondary | ICD-10-CM

## 2021-04-02 DIAGNOSIS — K219 Gastro-esophageal reflux disease without esophagitis: Secondary | ICD-10-CM

## 2021-04-02 DIAGNOSIS — G4700 Insomnia, unspecified: Secondary | ICD-10-CM

## 2021-04-02 DIAGNOSIS — G801 Spastic diplegic cerebral palsy: Secondary | ICD-10-CM

## 2021-04-02 NOTE — Telephone Encounter (Signed)
This has been done.

## 2021-05-24 ENCOUNTER — Other Ambulatory Visit (INDEPENDENT_AMBULATORY_CARE_PROVIDER_SITE_OTHER): Payer: Self-pay | Admitting: Family

## 2021-05-24 DIAGNOSIS — G4700 Insomnia, unspecified: Secondary | ICD-10-CM

## 2021-06-28 ENCOUNTER — Other Ambulatory Visit (INDEPENDENT_AMBULATORY_CARE_PROVIDER_SITE_OTHER): Payer: Self-pay | Admitting: Family

## 2021-06-28 DIAGNOSIS — G4700 Insomnia, unspecified: Secondary | ICD-10-CM

## 2021-06-28 DIAGNOSIS — K219 Gastro-esophageal reflux disease without esophagitis: Secondary | ICD-10-CM

## 2021-06-28 DIAGNOSIS — G801 Spastic diplegic cerebral palsy: Secondary | ICD-10-CM

## 2021-06-28 DIAGNOSIS — G809 Cerebral palsy, unspecified: Secondary | ICD-10-CM

## 2021-07-06 ENCOUNTER — Telehealth (INDEPENDENT_AMBULATORY_CARE_PROVIDER_SITE_OTHER): Payer: Medicare Other | Admitting: Family

## 2021-07-06 ENCOUNTER — Encounter (INDEPENDENT_AMBULATORY_CARE_PROVIDER_SITE_OTHER): Payer: Self-pay | Admitting: Family

## 2021-07-06 ENCOUNTER — Other Ambulatory Visit (INDEPENDENT_AMBULATORY_CARE_PROVIDER_SITE_OTHER): Payer: Self-pay | Admitting: Family

## 2021-07-06 DIAGNOSIS — G4700 Insomnia, unspecified: Secondary | ICD-10-CM | POA: Diagnosis not present

## 2021-07-06 DIAGNOSIS — G40309 Generalized idiopathic epilepsy and epileptic syndromes, not intractable, without status epilepticus: Secondary | ICD-10-CM

## 2021-07-06 DIAGNOSIS — G801 Spastic diplegic cerebral palsy: Secondary | ICD-10-CM | POA: Diagnosis not present

## 2021-07-06 DIAGNOSIS — F72 Severe intellectual disabilities: Secondary | ICD-10-CM

## 2021-07-06 MED ORDER — CLONIDINE HCL ER 0.1 MG PO TB12: ORAL_TABLET | ORAL | 3 refills | Status: DC

## 2021-07-06 MED ORDER — ALPRAZOLAM 0.25 MG PO TABS: ORAL_TABLET | ORAL | 5 refills | Status: DC

## 2021-07-06 MED ORDER — DIAZEPAM 2 MG PO TABS: ORAL_TABLET | ORAL | 5 refills | Status: DC

## 2021-07-06 MED ORDER — ZONISAMIDE 100 MG PO CAPS: 200.0000 mg | ORAL_CAPSULE | Freq: Every day | ORAL | 5 refills | Status: DC

## 2021-07-06 MED ORDER — TIZANIDINE HCL 2 MG PO TABS: 2.0000 mg | ORAL_TABLET | Freq: Every day | ORAL | 5 refills | Status: DC

## 2021-07-06 NOTE — Progress Notes (Signed)
? ?This is a Pediatric Specialist E-Visit consult/follow up provided via My Chart ?Abigail Hartman and her guardian Abigail Hartman consented to an E-Visit consult today.  ?Location of patient: Abigail Hartman is at home ?Location of provider: Normand Sloop is at office ?Patient was referred by Claretta Fraise, MD  ? ?The following participants were involved in this E-Visit: CMA, guardian, NP ? ?This visit was done via VIDEO  ? ?Chief Complain/ Reason for E-Visit today: follow up for seizures and behavior ?Total time on call: 15 min ?Follow up: 6 months ?  ?Abigail Hartman   ?MRN:  [id number]  ?06/18/1995  ? ?Provider: Rockwell Germany NP-C ?Location of Care: [facility] ? ?Visit type: return visit ? ?Last visit: 12/29/2020 ? ?Referral source: Claretta Fraise, MD ?History from: Epic chart, patient's guardian (her grandmother) ? 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 She does not have pituitary dysfunction. She has hypothyroidism that is managed by her PCP. She has generalized tonic-clonic seizures. She is taking and tolerating Zonisamide for her seizure disorder and has remained seizure free since September 2016. She takes Amantadine and Tizanidine for spasticity and movement, Clonidine ER for behavior and Hydroxyzine for sleep. She can be aggressive at times, pinching or pulling hair, particularly when she is frustrated or anxious. She is in the care of her grandparents. ? ?Today's concerns: ?Grandmother reports today that Abigail Hartman has remained seizure free since her last visit. She continues to have problems with insomnia and with intermittent aggressive behavior. Grandmother is generally able to manage the behavior but says that it can be difficult to  keep CNA's employed at times.  ? ?Grandmother has been diagnosed with lung cancer and is receiving chemotherapy treatments. She says that a family member helps her at times with Abigail Hartman as well as a CNA but that the CNA does not have set hours. Grandfather is also helpful in caring for Abigail Hartman when needed. ? ?Abigail Hartman has been otherwise generally healthy since she was last seen. Grandmother has no other health concerns for Abigail Hartman today other than previously mentioned. ? ?Review of systems: ?Please see HPI for neurologic and other pertinent review of systems. Otherwise all other systems were reviewed and were negative. ? ?Problem List: ?Patient Active Problem List  ? Diagnosis Date Noted  ? Spastic diplegia ([place]) 12/03/2014  ? Epilepsy, generalized, convulsive ([place]) 12/03/2014  ? Insomnia 12/03/2014  ? Cerebral palsy ([place]) 05/07/2014  ? Hypoxic ischemic encephalopathy, neonatal onset, severe 04/26/2014  ? 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 Blindness 04/26/2014  ? Severe intellectual disability 04/26/2014  ? GERD (gastroesophageal reflux disease) 07/18/2012  ? Hypothyroidism 07/18/2012  ?  ? ?Past Medical History:  ?Diagnosis Date  ? Blind   ? Developmental delay, severe   ? Hypothyroidism   ? Seizures ([place])   ?  ?Past medical history comments: See HPI ?Copied from previous record: ?Her last seizure occurred on December 18, 2014; prior to that 2011; seizures were generalized tonic-clonic in nature. ?  ?She has problems with insomnia and sometimes will be awake for three or four days straight and then sleep. ?  ?I reviewed her last evaluation at [facility].  Her last video EEG was poorly organized and showed delta and beta range activity.  There were four push button events  with no EEG seizure correlates and she had frequent eye movement artifact. ?  ?MRI scan of the brain at [facility] showed an absent septum pellucidum and hypoplastic optic nerves consistent with the condition noted septo-optic dysplasia.   ?  ?In the past, she had numerous hospitalizations for recurrent seizures, pneumonia, and at one point had a problem with aspiration pneumonia and a gastrostomy tube was placed for about one to two years.  It was removed about two years ago. ?  ?She is followed by Mcneil Sober at [facility] for endocrinology and also by pediatric nephrology at [facility].  She has also been followed at [facility] by orthopedic surgery and physical therapy. ? ?Surgical history: ?Past Surgical History:  ?Procedure Laterality Date  ? GI TUBE  2011  ?  ? ?Family history: ?family history is not on file.  ? ?Social history: ?Social History  ? ?Socioeconomic History  ? Marital status: Single  ?  Spouse name: Not on file  ? Number of children: Not on file  ? Years of education: Not on file  ? Highest education level: Not on file  ?Occupational History  ? Not on file  ?Tobacco Use  ? Smoking status: Never  ?  Passive exposure: Yes  ? Smokeless tobacco: Never  ?Substance and Sexual Activity  ? Alcohol use: No  ?  Alcohol/week: 0.0 standard drinks  ? Drug use: No  ? Sexual activity: Never  ?Other Topics Concern  ? Not on file  ?Social History Narrative  ? Abigail Hartman is a 26 yo female.  ? She graduated from [employer].  ? She lives with both parents.  ? She enjoys listening to music.  ? ?Social Determinants of Health  ? ?Financial Resource Strain: Not on file  ?Food Insecurity: Not on file  ?Transportation Needs: Not on file  ?Physical Activity: Not on file  ?Stress: Not on file  ?Social Connections: Not on file  ?Intimate Partner Violence: Not on file  ?  ?Past/failed meds: ? ?Allergies: ?Allergies  ?Allergen Reactions  ? Motrin [Ibuprofen] Rash  ?  ?Immunizations: ?Immunization History  ?Administered Date(s) Administered  ? DTaP 10/19/1995, 12/21/1995, 02/29/1996, 04/25/2000  ? Hepatitis B, ped/adol 10/19/1995  ? HiB (PRP-T) 10/19/1995, 12/21/1995, 02/29/1996  ? IPV 10/19/1995, 12/21/1995, 08/29/1996, 04/25/2000  ? Influenza, Seasonal,  Injecte, Preservative Fre 12/29/2009, 04/27/2010, 04/13/2011  ? Influenza,inj,Quad PF,6+ Mos 03/06/2014, 01/14/2015, 03/15/2016  ? Janssen (J&J) SARS-COV-2 Vaccination 07/06/2019  ? MMR 08/29/1996, 04/25/2000  ? Pneumococcal Conjugate-13 06/05/1999  ? Tdap 11/28/2007  ? Varicella 08/29/1996  ?  ?Diagnostics/Screenings: ? ?Physical Exam: ?Wt 71 lb (32.2 kg)   BMI 22.60 kg/m?   ?Examination was limited by video format ?General: well developed, well nourished young woman, seated in wheelchair, in no evident distress ?Head: normocephalic and atraumatic. No dysmorphic features. ?Neck: supple ?Musculoskeletal: no skeletal deformities or obvious scoliosis. ?Skin: no rashes or neurocutaneous lesions ? ?Neurologic Exam ?Mental Status: awake and fully alert. Has no language.  ?Cranial Nerves: turns to localize faces and objects in the periphery. Turns to localize sounds in the periphery. Facial movements are symmetric ?Motor: spastic quadriparesis  ?Sensory: withdrawal x 4 ?Coordination: unable to adequately assess due to patient's inability to participate in examination. Did not reach for objects. ?Gait and Station: unable to stand and bear weight ? ?Impression: ?Hypoxic ischemic encephalopathy, neonatal onset, severe ? ?Spastic diplegia ([place]) - Plan: tiZANidine (ZANAFLEX) 2 MG tablet ? ?Epilepsy, generalized, convulsive ([place]) - Plan: zonisamide (ZONEGRAN) 100 MG capsule ? ?Insomnia, unspecified  type - Plan: diazepam (VALIUM) 2 MG tablet, cloNIDine HCl (KAPVAY) 0.1 MG TB12 ER tablet ? ?Severe intellectual disability - Plan: diazepam (VALIUM) 2 MG tablet, cloNIDine HCl (KAPVAY) 0.1 MG TB12 ER tablet, ALPRAZolam (XANAX) 0.25 MG tablet  ? ? ?Recommendations for plan of care: ?The patient's previous Epic records were reviewed. Abigail Hartman has neither had nor required imaging or lab studies since the last visit. She is a 26 year old woman with history of neonatal HIE, with associated developmental delays, seizures, spastic  quadriparesis, hypoplastic optic nerves, and intermittent problems with behavior. She is taking and tolerating Zonisamide and has remained seizure free since September 2016. She will continue her medications as prescrib

## 2021-07-06 NOTE — Telephone Encounter (Signed)
Contacted the pharmacy and they inform that the 0.25mg  dose is on back order due to manufactor delays. It will be available in early June. Will route to provider for alternate solutions.  ?

## 2021-07-07 NOTE — Patient Instructions (Signed)
It was a pleasure to see you today! ? ?Instructions for you until your next appointment are as follows: ?Continue Abigail Hartman's medications as prescribed.  ?Let me know if you have any concerns. ?Please sign up for MyChart if you have not done so. ?Please plan to return for follow up in 6 months or sooner if needed. ? ?Feel free to contact our office during normal business hours at [phone] with questions or concerns. If there is no answer or the call is outside business hours, please leave a message and our clinic staff will call you back within the next business day.  If you have an urgent concern, please stay on the line for our after-hours answering service and ask for the on-call neurologist.   ?  ?I also encourage you to use MyChart to communicate with me more directly. If you have not yet signed up for MyChart within [facility], the front desk staff can help you. However, please note that this inbox is NOT monitored on nights or weekends, and response can take up to 2 business days.  Urgent matters should be discussed with the on-call pediatric neurologist.  ? ?At [facility], we are committed to providing exceptional care. You will receive a patient satisfaction survey through text or email regarding your visit today. Your opinion is important to me. Comments are appreciated.   ?

## 2021-07-08 ENCOUNTER — Encounter (INDEPENDENT_AMBULATORY_CARE_PROVIDER_SITE_OTHER): Payer: Self-pay | Admitting: Family

## 2021-08-17 ENCOUNTER — Ambulatory Visit: Payer: Medicare Other | Admitting: Family Medicine

## 2021-08-19 ENCOUNTER — Telehealth: Payer: Self-pay | Admitting: Family Medicine

## 2021-08-28 ENCOUNTER — Telehealth: Payer: Self-pay | Admitting: Family Medicine

## 2021-08-31 NOTE — Telephone Encounter (Signed)
That would be fine. WS

## 2021-08-31 NOTE — Telephone Encounter (Signed)
Returned call, no answer, left detailed voice message

## 2021-09-01 ENCOUNTER — Encounter: Payer: Medicare Other | Admitting: Family Medicine

## 2021-09-01 NOTE — Progress Notes (Unsigned)
Geographical information systems officer ( at [employer] office) Provider running behind as well.  Visit rescheduled  .ws

## 2021-09-03 ENCOUNTER — Telehealth (INDEPENDENT_AMBULATORY_CARE_PROVIDER_SITE_OTHER): Payer: Medicare Other | Admitting: Family Medicine

## 2021-09-03 DIAGNOSIS — G809 Cerebral palsy, unspecified: Secondary | ICD-10-CM | POA: Diagnosis not present

## 2021-09-03 NOTE — Progress Notes (Signed)
    Subjective:    Patient ID: Abigail Hartman, female    DOB: 20-Nov-1995, 26 y.o.   MRN: [id number]   HPI: Abigail Hartman is a 26 y.o. female presenting for renewal of supplies for care due to totalcare need as a result of CP.Boost plus, 24 can case per month. Supplement for meals when not eating well.   Also pt. Is incontinent of urine and bowel. Changes every 2 hours. Needs 12 daily.   Not sure which agency she uses. CAP case worker is [phone], Lupita Leash Needs handicap sticker.      10/28/2020    8:11 AM 08/30/2019    3:12 PM 11/15/2018    3:03 PM  Depression screen PHQ 2/9  Decreased Interest 0 0 0  Down, Depressed, Hopeless 0 0 0  PHQ - 2 Score 0 0 0     Relevant past medical, surgical, family and social history reviewed and updated as indicated.  Interim medical history since our last visit reviewed. Allergies and medications reviewed and updated.  ROS:  Review of Systems  Constitutional: Negative.  Negative for activity change and appetite change.  HENT: Negative.    Eyes:  Negative for visual disturbance.  Respiratory:  Negative for shortness of breath.   Cardiovascular:  Negative for chest pain.  Gastrointestinal:  Negative for abdominal pain.     Social History   Tobacco Use  Smoking Status Never   Passive exposure: Yes  Smokeless Tobacco Never       Objective:     Wt Readings from Last 3 Encounters:  07/06/21 71 lb (32.2 kg)  12/29/20 71 lb (32.2 kg)  12/24/20 71 lb (32.2 kg)     Exam deferred.  Video visit performed.   Assessment & Plan:   1. Hypoxic ischemic encephalopathy, neonatal onset, severe   2. Cerebral palsy, unspecified type (HCC)     No orders of the defined types were placed in this encounter.   No orders of the defined types were placed in this encounter.     Diagnoses and all orders for this visit:  Hypoxic ischemic encephalopathy, neonatal onset, severe  Cerebral palsy, unspecified type (HCC)    Virtual Visit via  telephone Note  I discussed the limitations, risks, security and privacy concerns of performing an evaluation and management service by telephone and the availability of in person appointments. The patient was identified with two identifiers. Pt.expressed understanding and agreed to proceed. Pt. Is at home. Dr. Darlyn Read is in his office.  Follow Up Instructions:   I discussed the assessment and treatment plan with the patient. The patient was provided an opportunity to ask questions and all were answered. The patient agreed with the plan and demonstrated an understanding of the instructions.   The patient was advised to call back or seek an in-person evaluation if the symptoms worsen or if the condition fails to improve as anticipated.   Total minutes  contact time: 23   Follow up plan: Return in about 6 months (around 03/05/2022), or if symptoms worsen or fail to improve.  Mechele Claude, MD [facility]

## 2021-09-06 ENCOUNTER — Encounter: Payer: Self-pay | Admitting: Family Medicine

## 2021-09-07 ENCOUNTER — Telehealth: Payer: Self-pay | Admitting: Emergency Medicine

## 2021-09-07 NOTE — Telephone Encounter (Signed)
Form filled out and faxed 

## 2021-09-07 NOTE — Telephone Encounter (Signed)
LM to get information regarding information for Handicap Placard. Advised to call back   Doloris Hall, RN

## 2021-10-25 ENCOUNTER — Other Ambulatory Visit: Payer: Self-pay | Admitting: Family Medicine

## 2021-11-09 ENCOUNTER — Other Ambulatory Visit: Payer: Self-pay | Admitting: Family Medicine

## 2021-12-12 ENCOUNTER — Other Ambulatory Visit (INDEPENDENT_AMBULATORY_CARE_PROVIDER_SITE_OTHER): Payer: Self-pay | Admitting: Family

## 2021-12-12 DIAGNOSIS — G801 Spastic diplegic cerebral palsy: Secondary | ICD-10-CM

## 2021-12-12 DIAGNOSIS — G4700 Insomnia, unspecified: Secondary | ICD-10-CM

## 2021-12-12 DIAGNOSIS — K219 Gastro-esophageal reflux disease without esophagitis: Secondary | ICD-10-CM

## 2021-12-12 DIAGNOSIS — G809 Cerebral palsy, unspecified: Secondary | ICD-10-CM

## 2022-01-07 ENCOUNTER — Encounter (INDEPENDENT_AMBULATORY_CARE_PROVIDER_SITE_OTHER): Payer: Self-pay | Admitting: Family

## 2022-01-07 ENCOUNTER — Ambulatory Visit (INDEPENDENT_AMBULATORY_CARE_PROVIDER_SITE_OTHER): Payer: Medicare Other | Admitting: Family

## 2022-01-07 VITALS — BP 98/60 | HR 78 | Wt <= 1120 oz

## 2022-01-07 DIAGNOSIS — G40309 Generalized idiopathic epilepsy and epileptic syndromes, not intractable, without status epilepticus: Secondary | ICD-10-CM

## 2022-01-07 DIAGNOSIS — G800 Spastic quadriplegic cerebral palsy: Secondary | ICD-10-CM

## 2022-01-07 DIAGNOSIS — R634 Abnormal weight loss: Secondary | ICD-10-CM

## 2022-01-07 DIAGNOSIS — G4700 Insomnia, unspecified: Secondary | ICD-10-CM | POA: Diagnosis not present

## 2022-01-07 DIAGNOSIS — G809 Cerebral palsy, unspecified: Secondary | ICD-10-CM

## 2022-01-07 DIAGNOSIS — G801 Spastic diplegic cerebral palsy: Secondary | ICD-10-CM

## 2022-01-07 DIAGNOSIS — F72 Severe intellectual disabilities: Secondary | ICD-10-CM | POA: Diagnosis not present

## 2022-01-07 DIAGNOSIS — K219 Gastro-esophageal reflux disease without esophagitis: Secondary | ICD-10-CM

## 2022-01-08 NOTE — Patient Instructions (Signed)
It was a pleasure to see you today!  Instructions for you until your next appointment are as follows: Continue Oris's medications as prescribed.  Keep on an eye on her intake. Her weight is down from the last visit but that may be a difference in scales or how she was weighed. If you notice that she is not eating as well or losing weight, please let me know.  Please sign up for MyChart if you have not done so. Please plan to return for follow up in 1 year or sooner if needed.  Feel free to contact our office during normal business hours at [phone] with questions or concerns. If there is no answer or the call is outside business hours, please leave a message and our clinic staff will call you back within the next business day.  If you have an urgent concern, please stay on the line for our after-hours answering service and ask for the on-call neurologist.     I also encourage you to use MyChart to communicate with me more directly. If you have not yet signed up for MyChart within [facility], the front desk staff can help you. However, please note that this inbox is NOT monitored on nights or weekends, and response can take up to 2 business days.  Urgent matters should be discussed with the on-call pediatric neurologist.   At [facility], we are committed to providing exceptional care. You will receive a patient satisfaction survey through text or email regarding your visit today. Your opinion is important to me. Comments are appreciated.

## 2022-01-09 ENCOUNTER — Other Ambulatory Visit (INDEPENDENT_AMBULATORY_CARE_PROVIDER_SITE_OTHER): Payer: Self-pay | Admitting: Family

## 2022-01-09 DIAGNOSIS — G801 Spastic diplegic cerebral palsy: Secondary | ICD-10-CM

## 2022-01-09 DIAGNOSIS — G4700 Insomnia, unspecified: Secondary | ICD-10-CM

## 2022-01-10 ENCOUNTER — Other Ambulatory Visit (INDEPENDENT_AMBULATORY_CARE_PROVIDER_SITE_OTHER): Payer: Self-pay | Admitting: Family

## 2022-01-10 ENCOUNTER — Encounter (INDEPENDENT_AMBULATORY_CARE_PROVIDER_SITE_OTHER): Payer: Self-pay | Admitting: Family

## 2022-01-10 DIAGNOSIS — F72 Severe intellectual disabilities: Secondary | ICD-10-CM

## 2022-01-10 DIAGNOSIS — R634 Abnormal weight loss: Secondary | ICD-10-CM | POA: Insufficient documentation

## 2022-01-10 DIAGNOSIS — G4700 Insomnia, unspecified: Secondary | ICD-10-CM

## 2022-01-10 MED ORDER — AMANTADINE HCL 100 MG PO TABS: ORAL_TABLET | ORAL | 3 refills | Status: DC

## 2022-01-10 MED ORDER — FAMOTIDINE 20 MG PO TABS: 20.0000 mg | ORAL_TABLET | Freq: Two times a day (BID) | ORAL | 3 refills | Status: DC

## 2022-01-10 MED ORDER — TIZANIDINE HCL 2 MG PO TABS: 2.0000 mg | ORAL_TABLET | Freq: Every day | ORAL | 3 refills | Status: DC

## 2022-01-10 MED ORDER — HYDROXYZINE HCL 25 MG PO TABS: 25.0000 mg | ORAL_TABLET | Freq: Three times a day (TID) | ORAL | 3 refills | Status: DC | PRN

## 2022-01-10 MED ORDER — DIAZEPAM 2 MG PO TABS: ORAL_TABLET | ORAL | 5 refills | Status: DC

## 2022-01-10 MED ORDER — ZONISAMIDE 100 MG PO CAPS: 200.0000 mg | ORAL_CAPSULE | Freq: Every day | ORAL | 3 refills | Status: DC

## 2022-01-10 MED ORDER — CLONIDINE HCL ER 0.1 MG PO TB12: ORAL_TABLET | ORAL | 3 refills | Status: DC

## 2022-01-10 MED ORDER — ALPRAZOLAM 0.5 MG PO TABS: ORAL_TABLET | ORAL | 5 refills | Status: DC
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
## 2022-01-26 ENCOUNTER — Encounter (INDEPENDENT_AMBULATORY_CARE_PROVIDER_SITE_OTHER): Payer: Self-pay

## 2022-01-27 ENCOUNTER — Encounter (INDEPENDENT_AMBULATORY_CARE_PROVIDER_SITE_OTHER): Payer: Self-pay

## 2022-01-31 ENCOUNTER — Other Ambulatory Visit: Payer: Self-pay | Admitting: Family Medicine

## 2022-02-01 NOTE — Telephone Encounter (Signed)
Pt's last TSH was 10/28/20 but has appt on 03/10/22

## 2022-02-05 ENCOUNTER — Ambulatory Visit (INDEPENDENT_AMBULATORY_CARE_PROVIDER_SITE_OTHER): Payer: Medicare Other

## 2022-02-05 DIAGNOSIS — Z23 Encounter for immunization: Secondary | ICD-10-CM | POA: Diagnosis not present

## 2022-03-08 ENCOUNTER — Ambulatory Visit (INDEPENDENT_AMBULATORY_CARE_PROVIDER_SITE_OTHER): Payer: Medicare Other | Admitting: Family Medicine

## 2022-03-08 ENCOUNTER — Encounter: Payer: Self-pay | Admitting: Family Medicine

## 2022-03-08 VITALS — BP 95/69 | HR 75 | Temp 98.3°F | Ht <= 58 in | Wt <= 1120 oz

## 2022-03-08 DIAGNOSIS — G804 Ataxic cerebral palsy: Secondary | ICD-10-CM | POA: Diagnosis not present

## 2022-03-08 DIAGNOSIS — J329 Chronic sinusitis, unspecified: Secondary | ICD-10-CM

## 2022-03-08 DIAGNOSIS — G40309 Generalized idiopathic epilepsy and epileptic syndromes, not intractable, without status epilepticus: Secondary | ICD-10-CM

## 2022-03-08 DIAGNOSIS — E038 Other specified hypothyroidism: Secondary | ICD-10-CM

## 2022-03-08 DIAGNOSIS — Z0000 Encounter for general adult medical examination without abnormal findings: Secondary | ICD-10-CM

## 2022-03-08 DIAGNOSIS — J40 Bronchitis, not specified as acute or chronic: Secondary | ICD-10-CM

## 2022-03-08 MED ORDER — CEFPROZIL 250 MG/5ML PO SUSR: ORAL | 0 refills | Status: DC

## 2022-03-08 NOTE — Progress Notes (Addendum)
Subjective:  Patient ID: Abigail Hartman, female    DOB: 17-Sep-1995  Age: 26 y.o. MRN: [id number]  CC: Annual Exam   HPI Abigail Hartman presents for annual exam. Seen recently by neuro. Managed for seizures. Brought in today by step grandfather who is her adoptive father. Abigail Hartman's grandmother, Abigail Hartman, who had brought her in previously died of cancer 6 mos ago. She now stays with her aunt, Abigail Hartman Monday through Friday and with dad Friday night to Monday morning. She has been doing well other than a few days of cough and congestion. No fever noted.  Diet has to be pureed, states dad, She will move things around in her mouth and swallow, but she does not chew even though her teeth are good. She has to have general anesthesia for dental cleanings and procedures, but those are done at least annually. She is blind, but hears well. She is covered by CAP services in addition to her medicare. She requires incontinence supplies for her total care needs including underpads, disposable underwear, sanitary disposable gloves and liners.  She does not swallow solids as noted above, as a result she requires nutritionally coomplete oral formula      03/08/2022    1:24 PM 10/28/2020    8:11 AM 08/30/2019    3:12 PM  Depression screen PHQ 2/9  Decreased Interest 1 0 0  Down, Depressed, Hopeless 0 0 0  PHQ - 2 Score 1 0 0  Altered sleeping 0    Tired, decreased energy 1    Change in appetite 1    Feeling bad or failure about yourself  0    Trouble concentrating 0    Moving slowly or fidgety/restless 0    Suicidal thoughts 0    PHQ-9 Score 3    Difficult doing work/chores Not difficult at all      History Abigail Hartman has a past medical history of Blind, Developmental delay, severe, Hypothyroidism, and Seizures (HCC).   She has a past surgical history that includes GI TUBE (2011).   Her family history is not on file.She reports that she has never smoked. She has been exposed to tobacco smoke. She has never used  smokeless tobacco. She reports that she does not drink alcohol and does not use drugs.    ROS Review of Systems  Reason unable to perform ROS: dad offers a few observations as noted.  Constitutional:  Negative for activity change.  HENT:  Positive for congestion.   Respiratory:  Positive for cough.     Objective:  BP 95/69   Pulse 75   Temp 98.3 F (36.8 C)   Ht 3\' 11"  (1.194 m)   Wt 60 lb (27.2 kg)   SpO2 94%   BMI 19.10 kg/m   BP Readings from Last 3 Encounters:  03/08/22 95/69  01/07/22 98/60  12/24/20 94/69    Wt Readings from Last 3 Encounters:  03/08/22 60 lb (27.2 kg)  01/07/22 62 lb 9.6 oz (28.4 kg)  07/06/21 71 lb (32.2 kg)     Physical Exam Constitutional:      General: She is not in acute distress.    Appearance: She is well-developed.  HENT:     Head: Normocephalic and atraumatic.  Eyes:     Conjunctiva/sclera: Conjunctivae normal.     Pupils: Pupils are equal, round, and reactive to light.  Neck:     Thyroid: No thyromegaly.  Cardiovascular:     Rate and Rhythm: Normal rate and regular rhythm.  Heart sounds: Normal heart sounds. No murmur heard. Pulmonary:     Effort: Pulmonary effort is normal. No respiratory distress.     Breath sounds: Normal breath sounds. No wheezing or rales.  Abdominal:     General: Bowel sounds are normal. There is no distension.     Palpations: Abdomen is soft.     Tenderness: There is no abdominal tenderness.  Musculoskeletal:        General: Normal range of motion.     Cervical back: Normal range of motion and neck supple.  Lymphadenopathy:     Cervical: No cervical adenopathy.  Skin:    General: Skin is warm and dry.     Coloration: Skin is pale.  Neurological:     Mental Status: She is alert. Mental status is at baseline.     Sensory: Sensory deficit present.     Coordination: Coordination abnormal.     Gait: Gait abnormal.  Psychiatric:        Behavior: Behavior normal.        Thought Content:  Thought content normal.        Judgment: Judgment normal.       Assessment & Plan:   Abigail Hartman was seen today for annual exam.  Diagnoses and all orders for this visit:  Well adult exam  Hypoxic ischemic encephalopathy, neonatal onset, severe -     CBC with Differential/Platelet -     CMP14+EGFR -     Lipid panel -     TSH + free T4  Ataxic cerebral palsy (HCC) -     CBC with Differential/Platelet -     CMP14+EGFR -     Lipid panel -     TSH + free T4  Other specified hypothyroidism -     CBC with Differential/Platelet -     CMP14+EGFR -     Lipid panel -     TSH + free T4  Epilepsy, generalized, convulsive (HCC) -     CBC with Differential/Platelet -     CMP14+EGFR -     Lipid panel -     TSH + free T4  Sinobronchitis -     CBC with Differential/Platelet -     CMP14+EGFR -     Lipid panel -     TSH + free T4  Other orders -     cefPROZIL (CEFZIL) 250 MG/5ML suspension; One tsp twice daily for ten days.       I am having Abigail Hartman start on cefPROZIL. I am also having her maintain her acetaminophen, polyethylene glycol powder, atorvastatin, zonisamide, Amantadine HCl, cloNIDine HCl, ALPRAZolam, diazepam, tiZANidine, hydrOXYzine, famotidine, and levothyroxine.  Allergies as of 03/08/2022       Reactions   Motrin [ibuprofen] Rash        Medication List        Accurate as of March 08, 2022  2:05 PM. If you have any questions, ask your nurse or doctor.          acetaminophen 160 MG/5ML solution Commonly known as: TYLENOL Take 240 mg by mouth every 6 (six) hours as needed for fever.   ALPRAZolam 0.5 MG tablet Commonly known as: XANAX Take 1/2 tablet up to 3 times per day as needed for agitation   Amantadine HCl 100 MG tablet Take 1/2 (one-half) tablet by mouth twice daily   atorvastatin 40 MG tablet Commonly known as: LIPITOR Take 1 tablet by mouth once daily   cefPROZIL 250 MG/5ML suspension  Commonly known as: CEFZIL One tsp twice  daily for ten days. Started by: Mechele Claude, MD   cloNIDine HCl 0.1 MG Tb12 ER tablet Commonly known as: KAPVAY TAKE 1 TABLET BY MOUTH IN THE MORNING AND 1 IN THE EVENING AND 2 AT BEDTIME   diazepam 2 MG tablet Commonly known as: VALIUM GIVE 2 TABLETS BY MOUTH AT BEDTIME   famotidine 20 MG tablet Commonly known as: PEPCID Take 1 tablet (20 mg total) by mouth 2 (two) times daily.   hydrOXYzine 25 MG tablet Commonly known as: ATARAX Take 1 tablet (25 mg total) by mouth 3 (three) times daily as needed.   levothyroxine 50 MCG tablet Commonly known as: SYNTHROID Take 1 tablet by mouth once daily   polyethylene glycol powder 17 GM/SCOOP powder Commonly known as: GLYCOLAX/MIRALAX TAKE 17 GRAMS BY MOUTH DAILY AS NEEDED FOR MODERATE CONSTIPATION   tiZANidine 2 MG tablet Commonly known as: ZANAFLEX Take 1 tablet (2 mg total) by mouth at bedtime.   zonisamide 100 MG capsule Commonly known as: ZONEGRAN Take 2 capsules (200 mg total) by mouth at bedtime.         Follow-up: Return in about 1 year (around 03/09/2023), or if symptoms worsen or fail to improve.  Mechele Claude, M.D.

## 2022-03-10 LAB — CBC WITH DIFFERENTIAL/PLATELET

## 2022-03-10 LAB — CMP14+EGFR
ALT: 79 IU/L — ABNORMAL HIGH (ref 0–32)
AST: 57 IU/L — ABNORMAL HIGH (ref 0–40)
Albumin/Globulin Ratio: 1.4 (ref 1.2–2.2)
Albumin: 4.2 g/dL (ref 4.0–5.0)
Alkaline Phosphatase: 143 IU/L — ABNORMAL HIGH (ref 44–121)
BUN/Creatinine Ratio: 23 (ref 9–23)
BUN: 11 mg/dL (ref 6–20)
Bilirubin Total: <0.2 mg/dL (ref 0.0–1.2)
CO2: 17 mmol/L — ABNORMAL LOW (ref 20–29)
Calcium: 9.1 mg/dL (ref 8.7–10.2)
Chloride: 106 mmol/L (ref 96–106)
Creatinine, Ser: 0.47 mg/dL — ABNORMAL LOW (ref 0.57–1.00)
Globulin, Total: 3.1 g/dL (ref 1.5–4.5)
Glucose: 87 mg/dL (ref 70–99)
Potassium: 4.4 mmol/L (ref 3.5–5.2)
Sodium: 141 mmol/L (ref 134–144)
Total Protein: 7.3 g/dL (ref 6.0–8.5)
eGFR: 135 mL/min/{1.73_m2} (ref >59)

## 2022-03-10 LAB — LIPID PANEL
Chol/HDL Ratio: 4.6 ratio — ABNORMAL HIGH (ref 0.0–4.4)
Cholesterol, Total: 106 mg/dL (ref 100–199)
HDL: 23 mg/dL — ABNORMAL LOW (ref >39)
LDL Chol Calc (NIH): 53 mg/dL (ref 0–99)
Triglycerides: 178 mg/dL — ABNORMAL HIGH (ref 0–149)
VLDL Cholesterol Cal: 30 mg/dL (ref 5–40)

## 2022-03-10 LAB — TSH+FREE T4
Free T4: 1.16 ng/dL (ref 0.82–1.77)
TSH: 1.24 u[IU]/mL (ref 0.450–4.500)

## 2022-03-15 ENCOUNTER — Other Ambulatory Visit: Payer: Self-pay | Admitting: *Deleted

## 2022-03-15 DIAGNOSIS — R748 Abnormal levels of other serum enzymes: Secondary | ICD-10-CM

## 2022-04-19 ENCOUNTER — Other Ambulatory Visit: Payer: Self-pay | Admitting: Family Medicine

## 2022-04-20 ENCOUNTER — Encounter (INDEPENDENT_AMBULATORY_CARE_PROVIDER_SITE_OTHER): Payer: Self-pay

## 2022-04-23 ENCOUNTER — Other Ambulatory Visit: Payer: Medicare Other

## 2022-04-23 DIAGNOSIS — R748 Abnormal levels of other serum enzymes: Secondary | ICD-10-CM

## 2022-04-24 LAB — HEPATIC FUNCTION PANEL
ALT: 22 IU/L (ref 0–32)
AST: 31 IU/L (ref 0–40)
Albumin: 5 g/dL (ref 4.0–5.0)
Alkaline Phosphatase: 137 IU/L — ABNORMAL HIGH (ref 44–121)
Bilirubin Total: 0.2 mg/dL (ref 0.0–1.2)
Bilirubin, Direct: <0.1 mg/dL (ref 0.00–0.40)
Total Protein: 7.9 g/dL (ref 6.0–8.5)

## 2022-04-26 NOTE — Progress Notes (Signed)
Hello Samanvitha,  Your lab result is normal and/or stable.Some minor variations that are not significant are commonly marked abnormal, but do not represent any medical problem for you.  Best regards, Claretta Fraise, M.D.

## 2022-05-01 ENCOUNTER — Other Ambulatory Visit: Payer: Self-pay | Admitting: Family Medicine

## 2022-05-07 ENCOUNTER — Other Ambulatory Visit (INDEPENDENT_AMBULATORY_CARE_PROVIDER_SITE_OTHER): Payer: Self-pay | Admitting: Family

## 2022-05-07 DIAGNOSIS — G4700 Insomnia, unspecified: Secondary | ICD-10-CM

## 2022-05-13 ENCOUNTER — Telehealth: Payer: Self-pay | Admitting: Family Medicine

## 2022-05-13 NOTE — Telephone Encounter (Signed)
Called and spoke with Abigail Hartman she is on [place]. States she has not eaten or drank anything in 2 days. She has diarrhea and vomiting.   Advised them to take her to the ER. She verbalized understanding. FYI

## 2022-05-13 NOTE — Telephone Encounter (Signed)
Dehydration is a huge concern. I agree. She must go to the E.D.

## 2022-05-16 ENCOUNTER — Encounter: Payer: Self-pay | Admitting: Family Medicine

## 2022-05-17 ENCOUNTER — Telehealth: Payer: Self-pay

## 2022-05-17 NOTE — Transitions of Care (Post Inpatient/ED Visit) (Unsigned)
   05/17/2022  Name: Abigail Hartman MRN: [id number] DOB: [birth year]  Today's TOC FU Call Status: Today's TOC FU Call Status:: Unsuccessul Call (1st Attempt) Unsuccessful Call (1st Attempt) Date: 05/17/22  Attempted to reach the patient regarding the most recent Inpatient/ED visit.  Follow Up Plan: Additional outreach attempts will be made to reach the patient to complete the Transitions of Care (Post Inpatient/ED visit) call.   Signature Juanda Crumble, [place] Direct Dial [phone]

## 2022-05-18 NOTE — Transitions of Care (Post Inpatient/ED Visit) (Signed)
   05/18/2022  Name: Abigail Hartman MRN: [id number] DOB: [birth year]  Today's TOC FU Call Status: Today's TOC FU Call Status:: Successful TOC FU Call Competed Unsuccessful Call (1st Attempt) Date: 05/17/22 [facility] FU Call Complete Date: 05/18/22  Transition Care Management Follow-up Telephone Call Date of Discharge: 05/14/22 Discharge Facility: Other ([facility]) Name of Other ([facility]) Discharge Facility: [facility] Type of Discharge: Inpatient Admission Primary Inpatient Discharge Diagnosis:: vomiting How have you been since you were released from the hospital?: Better Any questions or concerns?: No  Items Reviewed: Did you receive and understand the discharge instructions provided?: Yes Medications obtained and verified?: Yes (Medications Reviewed) Any new allergies since your discharge?: No Dietary orders reviewed?: Yes Do you have support at home?: No  Home Care and Equipment/Supplies: [place] Ordered?: NA Any new equipment or medical supplies ordered?: NA  Functional Questionnaire: Do you need assistance with bathing/showering or dressing?: Yes Do you need assistance with meal preparation?: Yes Do you need assistance with eating?: Yes Do you have difficulty maintaining continence: Yes Do you need assistance with getting out of bed/getting out of a chair/moving?: Yes Do you have difficulty managing or taking your medications?: Yes  Folllow up appointments reviewed: PCP Follow-up appointment confirmed?: [facility] Follow-up appointment confirmed?: NA Do you need transportation to your follow-up appointment?: No Do you understand care options if your condition(s) worsen?: Yes-patient verbalized understanding    [place], [place] Direct Dial [phone]

## 2022-05-26 ENCOUNTER — Ambulatory Visit: Payer: Medicare Other

## 2022-05-28 ENCOUNTER — Telehealth: Payer: Self-pay | Admitting: Family Medicine

## 2022-05-28 NOTE — Telephone Encounter (Signed)
Contacted Abigail Hartman to schedule their annual wellness visit. Appointment made for 05/31/2022.  Thank you,  Colletta [place],  [facility] Direct Dial ??[phone]

## 2022-05-31 ENCOUNTER — Other Ambulatory Visit: Payer: Self-pay | Admitting: Family Medicine

## 2022-05-31 ENCOUNTER — Ambulatory Visit (INDEPENDENT_AMBULATORY_CARE_PROVIDER_SITE_OTHER): Payer: Medicare Other

## 2022-05-31 ENCOUNTER — Telehealth: Payer: Self-pay

## 2022-05-31 VITALS — Ht <= 58 in | Wt <= 1120 oz

## 2022-05-31 DIAGNOSIS — Z0000 Encounter for general adult medical examination without abnormal findings: Secondary | ICD-10-CM

## 2022-05-31 NOTE — Progress Notes (Signed)
Subjective:   Abigail Hartman is a 27 y.o. female who presents for an Initial Medicare Annual Wellness Visit.  I connected with  Abigail Hartman on 05/31/22 by a audio enabled telemedicine application and verified that I am speaking with the correct person using two identifiers.  Patient Location: Home  Provider Location: Home Office  I discussed the limitations of evaluation and management by telemedicine. The patient expressed understanding and agreed to proceed.  Review of Systems     Cardiac Risk Factors include: sedentary lifestyle     Objective:    Today's Vitals   05/31/22 1405  Weight: 60 lb (27.2 kg)  Height: '3\' 11"'$  (1.194 m)   Body mass index is 19.1 kg/m.     05/31/2022    2:40 PM 12/18/2014    8:45 AM  Advanced Directives  Does Patient Have a Medical Advance Directive? Yes No  Type of Advance Directive Living will;Healthcare Power of Attorney   Does patient want to make changes to medical advance directive? No - Patient declined   Copy of [place] in Chart? No - copy requested     Current Medications (verified) Outpatient Encounter Medications as of 05/31/2022  Medication Sig   acetaminophen (TYLENOL) 160 MG/5ML solution Take 240 mg by mouth every 6 (six) hours as needed for fever.   ALPRAZolam (XANAX) 0.5 MG tablet Take 1/2 tablet up to 3 times per day as needed for agitation   Amantadine HCl 100 MG tablet Take 1/2 (one-half) tablet by mouth twice daily   atorvastatin (LIPITOR) 40 MG tablet Take 1 tablet by mouth once daily   cefPROZIL (CEFZIL) 250 MG/5ML suspension One tsp twice daily for ten days.   cloNIDine HCl (KAPVAY) 0.1 MG TB12 ER tablet TAKE 1 TABLET BY MOUTH IN THE MORNING AND 1 IN THE EVENING AND 2 AT BEDTIME   diazepam (VALIUM) 2 MG tablet GIVE 2 TABLETS BY MOUTH AT BEDTIME   famotidine (PEPCID) 20 MG tablet Take 1 tablet (20 mg total) by mouth 2 (two) times daily.   hydrOXYzine (ATARAX) 25 MG tablet Take 1 tablet by mouth three  times daily as needed   levothyroxine (SYNTHROID) 50 MCG tablet Take 1 tablet by mouth once daily   polyethylene glycol powder (GLYCOLAX/MIRALAX) 17 GM/SCOOP powder TAKE 17 GRAMS BY MOUTH DAILY AS NEEDED FOR MODERATE CONSTIPATION   tiZANidine (ZANAFLEX) 2 MG tablet Take 1 tablet (2 mg total) by mouth at bedtime.   zonisamide (ZONEGRAN) 100 MG capsule Take 2 capsules (200 mg total) by mouth at bedtime.   No facility-administered encounter medications on file as of 05/31/2022.    Allergies (verified) Motrin [ibuprofen]   History: Past Medical History:  Diagnosis Date   Blind    Developmental delay, severe    Hypothyroidism    Seizures ([place])    Past Surgical History:  Procedure Laterality Date   GI TUBE  2011   No family history on file. Social History   Socioeconomic History   Marital status: Single    Spouse name: Not on file   Number of children: Not on file   Years of education: Not on file   Highest education level: Not on file  Occupational History   Not on file  Tobacco Use   Smoking status: Never    Passive exposure: Yes   Smokeless tobacco: Never  Substance and Sexual Activity   Alcohol use: No    Alcohol/week: 0.0 standard drinks of alcohol   Drug use: No  Sexual activity: Never  Other Topics Concern   Not on file  Social History Narrative   Mother Deceased 09/09/2021 from [place] is a 27 yo female.   She graduated from [employer]   She enjoys listening to music.   Social Determinants of Health   Financial Resource Strain: Unknown (05/31/2022)   Overall Financial Resource Strain (CARDIA)    Difficulty of Paying Living Expenses: Patient refused  Food Insecurity: No Food Insecurity (05/31/2022)   Hunger Vital Sign    Worried About Running Out of Food in the Last Year: Never true    Ran Out of Food in the Last Year: Never true  Transportation Needs: No Transportation Needs (05/31/2022)   PRAPARE - Hydrologist (Medical):  No    Lack of Transportation (Non-Medical): No  Physical Activity: Inactive (05/31/2022)   Exercise Vital Sign    Days of Exercise per Week: 0 days    Minutes of Exercise per Session: 0 min  Stress: Unknown (05/31/2022)   Abigail Hartman    Feeling of Stress : Patient refused  Social Connections: Unknown (05/31/2022)   Social Connection and Isolation Panel [NHANES]    Frequency of Communication with Friends and Family: Patient refused    Frequency of Social Gatherings with Friends and Family: Patient refused    Attends Religious Services: Patient refused    Marine scientist or Organizations: Patient refused    Attends Music therapist: Patient refused    Marital Status: Never married    Tobacco Counseling Counseling given: Not Answered   Clinical Intake:  Pre-visit preparation completed: Yes  Pain : No/denies pain  Diabetes: No  How often do you need to have someone help you when you read instructions, pamphlets, or other written materials from your doctor or pharmacy?: 5 - Always  Diabetic?No   Interpreter Needed?: No  Comments: Assisted with visit by father-Abigail Hartman Information entered by :: Abigail George LPN   Activities of Daily Living    05/31/2022    2:41 PM 05/30/2022    2:39 PM  In your present state of health, do you have any difficulty performing the following activities:  Hearing? 0 0  Vision? 1 1  Difficulty concentrating or making decisions? 1 1  Walking or climbing stairs? 1 1  Dressing or bathing? 1 1  Doing errands, shopping? 1 1  Preparing Food and eating ? Y Y  Using the Toilet? Y Y  In the past six months, have you accidently leaked urine? Y Y  Do you have problems with loss of bowel control? Y Y  Managing your Medications? Y Y  Managing your Finances? Abigail Hartman  Housekeeping or managing your Housekeeping? Abigail Hartman    Patient Care Team: Abigail Fraise, MD as PCP -  General (Family Medicine) Abigail Germany, NP as Nurse Practitioner (Neurology)  Indicate any recent Medical Services you may have received from other than [facility] providers in the past year (date may be approximate).     Assessment:   This is a routine wellness examination for Abigail Hartman.  Hearing/Vision screen Hearing Screening - Comments:: Denies hearing difficulties   Vision Screening - Comments:: No changes with vision; declines routine eye exam at this time    Dietary issues and exercise activities discussed: Current Exercise Habits: The patient does not participate in regular exercise at present   Goals Addressed  This Visit's Progress    Improve Insomnia       Prevent falls        Depression Screen    05/31/2022    2:23 PM 03/08/2022    1:24 PM 10/28/2020    8:11 AM 08/30/2019    3:12 PM 11/15/2018    3:03 PM 08/27/2016   10:39 AM 06/14/2016   11:50 AM  PHQ 2/9 Scores  PHQ - 2 Score  1 0 0 0    PHQ- 9 Score  3       Exception Documentation Other- indicate reason in comment box     Medical reason Medical reason  Not completed caregiver refusal          Fall Risk    05/31/2022    2:09 PM 05/30/2022    2:39 PM 03/08/2022    1:04 PM 10/28/2020    8:11 AM 08/30/2019    3:12 PM  Fall Risk   Falls in the past year? 0 0 Exclusion - non ambulatory 0 0  Number falls in past yr: 0 0   0  Injury with Fall? 0 0   0  Risk for fall due to : Impaired mobility;Impaired balance/gait    Impaired balance/gait;Impaired mobility  Follow up Falls prevention discussed;Education provided;Falls evaluation completed    Falls evaluation completed    FALL RISK PREVENTION PERTAINING TO THE HOME:  Any stairs in or around the home? No  If so, are there any without handrails? No  Home free of loose throw rugs in walkways, pet beds, electrical cords, etc? Yes  Adequate lighting in your home to reduce risk of falls? Yes   ASSISTIVE DEVICES UTILIZED TO PREVENT FALLS:  Life alert? No  Use  of a cane, walker or w/c? Yes  Grab bars in the bathroom? Yes  Shower chair or bench in shower? Yes  Elevated toilet seat or a handicapped toilet? Yes   TIMED UP AND GO:  Was the test performed? No . Telephonic visit   Cognitive Function:        Immunizations Immunization History  Administered Date(s) Administered   DTaP 10/19/1995, 12/21/1995, 02/29/1996, 04/25/2000   HIB (PRP-T) 10/19/1995, 12/21/1995, 02/29/1996   Hepatitis B, PED/ADOLESCENT 10/19/1995   IPV 10/19/1995, 12/21/1995, 08/29/1996, 04/25/2000   Influenza, Seasonal, Injecte, Preservative Fre 12/29/2009, 04/27/2010, 04/13/2011   Influenza,inj,Quad PF,6+ Mos 03/06/2014, 01/14/2015, 03/15/2016, 02/05/2022   Janssen (J&J) SARS-COV-2 Vaccination 07/06/2019   MMR 08/29/1996, 04/25/2000   Pneumococcal Conjugate-13 06/05/1999   Tdap 11/28/2007   Varicella 08/29/1996    TDAP status: Due, Education has been provided regarding the importance of this vaccine. Advised may receive this vaccine at local pharmacy or Health Dept. Aware to provide a copy of the vaccination record if obtained from local pharmacy or Health Dept. Verbalized acceptance and understanding.  Flu Vaccine status: Up to date  Pneumococcal vaccine status: Up to date  Covid-19 vaccine status: Information provided on how to obtain vaccines.   Qualifies for Shingles Vaccine? No    Screening Tests Health Maintenance  Topic Date Due   HPV VACCINES (1 - 2-dose series) Never done   HIV Screening  Never done   PAP-Cervical Cytology Screening  Never done   PAP SMEAR-Modifier  Never done   DTaP/Tdap/Td (6 - Td or Tdap) 11/27/2017   COVID-19 Vaccine (2 - 2023-24 season) 11/27/2021   Hepatitis C Screening  05/31/2023 (Originally 08/08/2013)   Medicare Annual Wellness (AWV)  05/31/2023   INFLUENZA VACCINE  Completed  Health Maintenance  Health Maintenance Due  Topic Date Due   HPV VACCINES (1 - 2-dose series) Never done   HIV Screening  Never done    PAP-Cervical Cytology Screening  Never done   PAP SMEAR-Modifier  Never done   DTaP/Tdap/Td (6 - Td or Tdap) 11/27/2017   COVID-19 Vaccine (2 - 2023-24 season) 11/27/2021    Lung Cancer Screening: (Low Dose CT Chest recommended if Age 31-80 years, 30 pack-year currently smoking OR have quit w/in 15years.) does not qualify.   Lung Cancer Screening Referral: n/a  Additional Screening:  Hepatitis C Screening: does qualify  Vision Screening: Recommended annual ophthalmology exams for early detection of glaucoma and other disorders of the eye. Is the patient up to date with their annual eye exam?  No  Who is the provider or what is the name of the office in which the patient attends annual eye exams? none If pt is not established with a provider, would they like to be referred to a provider to establish care? No .   Dental Screening: Recommended annual dental exams for proper oral hygiene  Community Resource Referral / Chronic Care Management: CRR required this visit?  No   CCM required this visit?  No      Plan:     I have personally reviewed and noted the following in the patient's chart:   Medical and social history Use of alcohol, tobacco or illicit drugs  Current medications and supplements including opioid prescriptions. Patient is not currently taking opioid prescriptions. Functional ability and status Nutritional status Physical activity Advanced directives List of other physicians Hospitalizations, surgeries, and ER visits in previous 12 months Vitals Screenings to include cognitive, depression, and falls Referrals and appointments  In addition, I have reviewed and discussed with patient certain preventive protocols, quality metrics, and best practice recommendations. A written personalized care plan for preventive services as well as general preventive health recommendations were provided to patient.     Vanetta Mulders, [place]   [id number]   Due to this  being a virtual visit, the after visit summary with patients personalized plan was offered to patient via mail or my-chart. Patient would like to access on my-chart  Nurse Notes: Father interested in if there is anything that can be prescribed prn to help with insomnia that will not cause problems on what she is already taking

## 2022-05-31 NOTE — Telephone Encounter (Signed)
Patient see for AWV and father interested in if there is anything that can be prescribed prn to help with insomnia that will not cause problems on what she is already taking.  He has tried making changes to her schedule to see if this will help but states that the insomnia is random and sometimes she will go days at a time without sleeping

## 2022-05-31 NOTE — Telephone Encounter (Signed)
Father aware

## 2022-05-31 NOTE — Telephone Encounter (Signed)
Since she is already taking valium and xanax at bedtime, I cannot recommend anything else.

## 2022-05-31 NOTE — Patient Instructions (Signed)
Abigail Hartman , Thank you for taking time to come for your Medicare Wellness Visit. I appreciate your ongoing commitment to your health goals. Please review the following plan we discussed and let me know if I can assist you in the future.   These are the goals we discussed:  Goals      Improve Insomnia     Prevent falls        This is a list of the screening recommended for you and due dates:  Health Maintenance  Topic Date Due   HPV Vaccine (1 - 2-dose series) Never done   HIV Screening  Never done   Pap Smear  Never done   Pap Smear  Never done   DTaP/Tdap/Td vaccine (6 - Td or Tdap) 11/27/2017   COVID-19 Vaccine (2 - 2023-24 season) 11/27/2021   Hepatitis C Screening: USPSTF Recommendation to screen - Ages 18-79 yo.  05/31/2023*   Medicare Annual Wellness Visit  05/31/2023   Flu Shot  Completed  *Topic was postponed. The date shown is not the original due date.    Advanced directives: Please bring a copy of your health care power of attorney and living will to the office to be added to your chart at your convenience.   Conditions/risks identified: Aim for 30 minutes of exercise or brisk walking, 6-8 glasses of water, and 5 servings of fruits and vegetables each day.   Next appointment: Follow up in one year for your annual wellness visit.   Preventive Care 42-35 Years Old, Female Preventive care refers to lifestyle choices and visits with your health care provider that can promote health and wellness. Preventive care visits are also called wellness exams. What can I expect for my preventive care visit? Counseling During your preventive care visit, your health care provider may ask about your: Medical history, including: Past medical problems. Family medical history. Pregnancy history. Current health, including: Menstrual cycle. Method of birth control. Emotional well-being. Home life and relationship well-being. Sexual activity and sexual health. Lifestyle,  including: Alcohol, nicotine or tobacco, and drug use. Access to firearms. Diet, exercise, and sleep habits. Work and work Statistician. Sunscreen use. Safety issues such as seatbelt and bike helmet use. Physical exam Your health care provider may check your: Height and weight. These may be used to calculate your BMI (body mass index). BMI is a measurement that tells if you are at a healthy weight. Waist circumference. This measures the distance around your waistline. This measurement also tells if you are at a healthy weight and may help predict your risk of certain diseases, such as type 2 diabetes and high blood pressure. Heart rate and blood pressure. Body temperature. Skin for abnormal spots. What immunizations do I need? Vaccines are usually given at various ages, according to a schedule. Your health care provider will recommend vaccines for you based on your age, medical history, and lifestyle or other factors, such as travel or where you work. What tests do I need? Screening Your health care provider may recommend screening tests for certain conditions. This may include: Pelvic exam and Pap test. Lipid and cholesterol levels. Diabetes screening. This is done by checking your blood sugar (glucose) after you have not eaten for a while (fasting). Hepatitis B test. Hepatitis C test. HIV (human immunodeficiency virus) test. STI (sexually transmitted infection) testing, if you are at risk. BRCA-related cancer screening. This may be done if you have a family history of breast, ovarian, tubal, or peritoneal cancers. Talk with your  health care provider about your test results, treatment options, and if necessary, the need for more tests. Follow these instructions at home: Eating and drinking  Eat a healthy diet that includes fresh fruits and vegetables, whole grains, lean protein, and low-fat dairy products. Take vitamin and mineral supplements as recommended by your health care  provider. Do not drink alcohol if: Your health care provider tells you not to drink. You are pregnant, may be pregnant, or are planning to become pregnant. If you drink alcohol: Limit how much you have to 0-1 drink a day. Know how much alcohol is in your drink. In the [place] one drink equals one 12 oz bottle of beer (355 mL), one 5 oz glass of wine (148 mL), or one 1 oz glass of hard liquor (44 mL). Lifestyle Brush your teeth every morning and night with fluoride toothpaste. Floss one time each day. Exercise for at least 30 minutes 5 or more days each week. Do not use any products that contain nicotine or tobacco. These products include cigarettes, chewing tobacco, and vaping devices, such as e-cigarettes. If you need help quitting, ask your health care provider. Do not use drugs. If you are sexually active, practice safe sex. Use a condom or other form of protection to prevent STIs. If you do not wish to become pregnant, use a form of birth control. If you plan to become pregnant, see your health care provider for a prepregnancy visit. Find healthy ways to manage stress, such as: Meditation, yoga, or listening to music. Journaling. Talking to a trusted person. Spending time with friends and family. Minimize exposure to UV radiation to reduce your risk of skin cancer. Safety Always wear your seat belt while driving or riding in a vehicle. Do not drive: If you have been drinking alcohol. Do not ride with someone who has been drinking. If you have been using any mind-altering substances or drugs. While texting. When you are tired or distracted. Wear a helmet and other protective equipment during sports activities. If you have firearms in your house, make sure you follow all gun safety procedures. Seek help if you have been physically or sexually abused. What's next? Go to your health care provider once a year for an annual wellness visit. Ask your health care provider how often you  should have your eyes and teeth checked. Stay up to date on all vaccines. This information is not intended to replace advice given to you by your health care provider. Make sure you discuss any questions you have with your health care provider. Document Revised: 09/10/2020 Document Reviewed: 09/10/2020 Elsevier Patient Education  [employer].

## 2022-06-12 ENCOUNTER — Encounter (INDEPENDENT_AMBULATORY_CARE_PROVIDER_SITE_OTHER): Payer: Self-pay

## 2022-06-12 DIAGNOSIS — G809 Cerebral palsy, unspecified: Secondary | ICD-10-CM

## 2022-06-12 DIAGNOSIS — G801 Spastic diplegic cerebral palsy: Secondary | ICD-10-CM

## 2022-06-12 DIAGNOSIS — G4700 Insomnia, unspecified: Secondary | ICD-10-CM

## 2022-06-21 MED ORDER — AMANTADINE HCL 100 MG PO TABS: ORAL_TABLET | ORAL | 3 refills | Status: DC

## 2022-06-21 NOTE — Addendum Note (Signed)
Addended by: Joelyn Oms on: 06/21/2022 09:13 AM   Modules accepted: Orders

## 2022-07-08 ENCOUNTER — Other Ambulatory Visit (INDEPENDENT_AMBULATORY_CARE_PROVIDER_SITE_OTHER): Payer: Self-pay

## 2022-07-08 NOTE — Telephone Encounter (Signed)
Received a fax from pharmacy stating that the [facility] for Amantadine isn't covered.   Called [employer] and sumbitted an appeal. Faxed over last Office visit.   [phone] [phone].   SS, CCMA

## 2022-07-14 ENCOUNTER — Telehealth (INDEPENDENT_AMBULATORY_CARE_PROVIDER_SITE_OTHER): Payer: Self-pay | Admitting: Family

## 2022-07-14 NOTE — Telephone Encounter (Signed)
Her insurance will not cover this medication. Dad is aware and is paying for it out of pocket. TG

## 2022-07-14 NOTE — Telephone Encounter (Signed)
  Name of who is calling: Mark  Caller's Relationship to Patient: Prior [employer] Dept  Best contact number: [phone]  Provider they see: Elveria Rising  Reason for call: Loraine Leriche left a voicemail stating that he received the request for medication but their not the ones who delegate the review its the [employer] who does the next level appeals. He left a callback number for solutions and a 2 fax numbers.  Phone: [phone] Standard Fax: [phone] Urgent Fax: [phone]     PRESCRIPTION REFILL ONLY  Name of prescription: Amantadine  Pharmacy:

## 2022-07-15 ENCOUNTER — Other Ambulatory Visit: Payer: Self-pay | Admitting: Family Medicine

## 2022-07-25 ENCOUNTER — Other Ambulatory Visit (INDEPENDENT_AMBULATORY_CARE_PROVIDER_SITE_OTHER): Payer: Self-pay | Admitting: Family

## 2022-07-25 DIAGNOSIS — F72 Severe intellectual disabilities: Secondary | ICD-10-CM

## 2022-07-25 DIAGNOSIS — G4700 Insomnia, unspecified: Secondary | ICD-10-CM

## 2022-07-30 ENCOUNTER — Telehealth: Payer: Self-pay | Admitting: Family Medicine

## 2022-07-30 NOTE — Telephone Encounter (Signed)
[facility] called to see if pts PCP had a chance to approve medical supply request that has been sent to [place]. Needs to be sent back to them before 5/11.   Please advise. Made Rep aware that PCP has been out of office due to death in the family and will return on Monday.

## 2022-07-30 NOTE — Telephone Encounter (Signed)
Received today and on PCPs desk

## 2022-08-03 NOTE — Telephone Encounter (Signed)
Forms faxed today

## 2022-08-12 ENCOUNTER — Telehealth: Payer: Self-pay | Admitting: Family Medicine

## 2022-08-12 NOTE — Telephone Encounter (Signed)
  Incoming Patient Call  08/12/2022  What symptoms do you have? Stuffy nose  How long have you been sick? Woke up this morning and had stuffy nose  Have you been seen for this problem? No, aunt wants to know which OTC medication is ok to give to pt along with the medications she is currently on    If your provider decides to give you a prescription, which pharmacy would you like for it to be sent to? [facility] [place]  Patient informed that this information will be sent to the clinical staff for review and that they should receive a follow up call.

## 2022-08-12 NOTE — Telephone Encounter (Signed)
Any mild OTC cough and cold med would be okay. Mucinex DM is a favorite I recommend

## 2022-08-12 NOTE — Telephone Encounter (Signed)
Patient aunt is aware

## 2022-10-10 ENCOUNTER — Other Ambulatory Visit: Payer: Self-pay | Admitting: Family Medicine

## 2022-12-09 ENCOUNTER — Other Ambulatory Visit (INDEPENDENT_AMBULATORY_CARE_PROVIDER_SITE_OTHER): Payer: Self-pay | Admitting: Family

## 2022-12-09 DIAGNOSIS — G4700 Insomnia, unspecified: Secondary | ICD-10-CM

## 2023-01-02 ENCOUNTER — Encounter: Payer: Self-pay | Admitting: Family Medicine

## 2023-01-06 ENCOUNTER — Other Ambulatory Visit (INDEPENDENT_AMBULATORY_CARE_PROVIDER_SITE_OTHER): Payer: Self-pay | Admitting: Family

## 2023-01-06 DIAGNOSIS — G4700 Insomnia, unspecified: Secondary | ICD-10-CM

## 2023-01-06 DIAGNOSIS — G40309 Generalized idiopathic epilepsy and epileptic syndromes, not intractable, without status epilepticus: Secondary | ICD-10-CM

## 2023-01-08 ENCOUNTER — Other Ambulatory Visit (INDEPENDENT_AMBULATORY_CARE_PROVIDER_SITE_OTHER): Payer: Self-pay | Admitting: Family

## 2023-01-08 DIAGNOSIS — G801 Spastic diplegic cerebral palsy: Secondary | ICD-10-CM

## 2023-01-10 NOTE — Telephone Encounter (Signed)
Last OV 01/07/2022 Next OV tomorrow-  Rx pended to refill at appt

## 2023-01-11 ENCOUNTER — Ambulatory Visit (INDEPENDENT_AMBULATORY_CARE_PROVIDER_SITE_OTHER): Payer: Medicare Other | Admitting: Family

## 2023-01-11 ENCOUNTER — Encounter (INDEPENDENT_AMBULATORY_CARE_PROVIDER_SITE_OTHER): Payer: Self-pay | Admitting: Family

## 2023-01-11 DIAGNOSIS — G4700 Insomnia, unspecified: Secondary | ICD-10-CM | POA: Diagnosis not present

## 2023-01-11 DIAGNOSIS — K219 Gastro-esophageal reflux disease without esophagitis: Secondary | ICD-10-CM | POA: Diagnosis not present

## 2023-01-11 DIAGNOSIS — F72 Severe intellectual disabilities: Secondary | ICD-10-CM

## 2023-01-11 DIAGNOSIS — G801 Spastic diplegic cerebral palsy: Secondary | ICD-10-CM

## 2023-01-11 DIAGNOSIS — G40309 Generalized idiopathic epilepsy and epileptic syndromes, not intractable, without status epilepticus: Secondary | ICD-10-CM

## 2023-01-11 DIAGNOSIS — H47033 Optic nerve hypoplasia, bilateral: Secondary | ICD-10-CM

## 2023-01-11 MED ORDER — DIAZEPAM 2 MG PO TABS: ORAL_TABLET | ORAL | 5 refills | Status: DC

## 2023-01-11 MED ORDER — FAMOTIDINE 20 MG PO TABS: 20.0000 mg | ORAL_TABLET | Freq: Two times a day (BID) | ORAL | 3 refills | Status: DC

## 2023-01-11 MED ORDER — CLONIDINE HCL ER 0.1 MG PO TB12: ORAL_TABLET | ORAL | 3 refills | Status: DC

## 2023-01-11 MED ORDER — ALPRAZOLAM 0.5 MG PO TABS: ORAL_TABLET | ORAL | 5 refills | Status: DC

## 2023-01-11 NOTE — Patient Instructions (Addendum)
It was a pleasure to see you today!  Instructions for you until your next appointment are as follows: Continue Tayana's medications as prescribed Call for questions or concerns Please sign up for MyChart if you have not done so. Please plan to return for follow up in 1 year or sooner if needed.  Feel free to contact our office during normal business hours at [phone] with questions or concerns. If there is no answer or the call is outside business hours, please leave a message and our clinic staff will call you back within the next business day.  If you have an urgent concern, please stay on the line for our after-hours answering service and ask for the on-call neurologist.     I also encourage you to use MyChart to communicate with me more directly. If you have not yet signed up for MyChart within [facility], the front desk staff can help you. However, please note that this inbox is NOT monitored on nights or weekends, and response can take up to 2 business days.  Urgent matters should be discussed with the on-call pediatric neurologist.   At [facility], we are committed to providing exceptional care. You will receive a patient satisfaction survey through text or email regarding your visit today. Your opinion is important to me. Comments are appreciated.

## 2023-01-11 NOTE — Progress Notes (Unsigned)
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 She does not have pituitary dysfunction. She has hypothyroidism that is managed by her PCP. She has generalized tonic-clonic seizures. She is taking and tolerating Zonisamide for her seizure disorder and has remained seizure free since September 2016. She takes Amantadine and Tizanidine for spasticity and movement, Clonidine ER for behavior and Hydroxyzine for sleep. She can be aggressive at times, pinching or pulling hair, particularly when she is frustrated or anxious. She was adopted by her grandparents and has no contact with her biological mother. Her grandmother passed away unexpectedly in 2023-08-01and Roopa is cared for by her adoptive father and her biologic aunt.   Today's concerns: She  [name] has been otherwise generally healthy since she was last seen. No health concerns today other than previously mentioned.  Review of systems: Please see HPI for neurologic and other pertinent review of systems. Otherwise all other systems were reviewed and were negative.  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   Insomnia 12/03/2014   Cerebral palsy (HCC) 05/07/2014   Hypoxic ischemic encephalopathy, neonatal onset, severe 04/26/2014   Optic nerve hypoplasia of both eyes  04/26/2014   Blindness 04/26/2014   Severe intellectual disability 04/26/2014   Gastroesophageal reflux disease without esophagitis 07/18/2012   Hypothyroidism 07/18/2012     Past Medical History:  Diagnosis Date   Blind    Developmental delay, severe    Hypothyroidism    Seizures (HCC)     Past medical history comments: See HPI Copied from previous record: Her last seizure occurred on December 18, 2014; prior to that 2011; seizures were generalized tonic-clonic in nature.   She has problems with insomnia and sometimes will be awake for three or four days straight and then sleep.   I reviewed her last evaluation at [facility].  Her last video EEG was poorly organized and showed delta and beta range activity.  There were four push button events with no EEG seizure correlates and she had frequent eye movement artifact.   MRI scan of the brain at [facility] showed an absent septum pellucidum and hypoplastic optic nerves consistent with the condition noted septo-optic dysplasia.    In the past, she had numerous hospitalizations for recurrent seizures, pneumonia, and at one point had a problem with aspiration pneumonia and a gastrostomy tube was placed for about one to two years.  It was removed about two years ago.   She is followed by Joni Fears at [facility] for endocrinology and also by pediatric nephrology at [facility].  She has also been followed at [facility] by orthopedic surgery and physical therapy.  Surgical history: Past Surgical History:  Procedure Laterality Date   GI TUBE  2011     Family history: family history is not on file.   Social history: Social History   Socioeconomic History  Marital status: Single    Spouse name: Not on file   Number of children: Not on file   Years of education: Not on file   Highest education level: Not on file  Occupational History   Not on file  Tobacco Use   Smoking status: Never    Passive exposure: Yes    Smokeless tobacco: Never  Substance and Sexual Activity   Alcohol use: No    Alcohol/week: 0.0 standard drinks of alcohol   Drug use: No   Sexual activity: Never  Other Topics Concern   Not on file  Social History Narrative   Mother Deceased 07-14-23from Cancer   Abigail Hartman is a 27 yo female.   She graduated from [employer]   She enjoys listening to music.   Social Determinants of Health   Financial Resource Strain: Patient Declined (05/31/2022)   Overall Financial Resource Strain (CARDIA)    Difficulty of Paying Living Expenses: Patient declined  Food Insecurity: No Food Insecurity (05/31/2022)   Hunger Vital Sign    Worried About Running Out of Food in the Last Year: Never true    Ran Out of Food in the Last Year: Never true  Transportation Needs: No Transportation Needs (05/31/2022)   PRAPARE - Administrator, Civil Service (Medical): No    Lack of Transportation (Non-Medical): No  Physical Activity: Inactive (05/31/2022)   Exercise Vital Sign    Days of Exercise per Week: 0 days    Minutes of Exercise per Session: 0 min  Stress: Patient Declined (05/31/2022)   [employer] of Occupational Health - Occupational Stress Questionnaire    Feeling of Stress : Patient declined  Social Connections: Unknown (05/31/2022)   Social Connection and Isolation Panel [NHANES]    Frequency of Communication with Friends and Family: Patient declined    Frequency of Social Gatherings with Friends and Family: Patient declined    Attends Religious Services: Patient declined    Database administrator or Organizations: Patient declined    Attends Banker Meetings: Patient declined    Marital Status: Never married  Intimate Partner Violence: Unknown (07/02/2021)   Received from [employer]   HITS    Physically Hurt: Not on file    Insult or Talk Down To: Not on file    Threaten Physical Harm: Not on file    Scream or Curse: Not on file    Past/failed  meds:  Allergies: Allergies  Allergen Reactions   Motrin [Ibuprofen] Rash    Immunizations: Immunization History  Administered Date(s) Administered   DTaP 10/19/1995, 12/21/1995, 02/29/1996, 04/25/2000   HIB (PRP-T) 10/19/1995, 12/21/1995, 02/29/1996   Hepatitis B, PED/ADOLESCENT 10/19/1995   IPV 10/19/1995, 12/21/1995, 08/29/1996, 04/25/2000   Influenza, Seasonal, Injecte, Preservative Fre 12/29/2009, 04/27/2010, 04/13/2011   Influenza,inj,Quad PF,6+ Mos 03/06/2014, 01/14/2015, 03/15/2016, 02/05/2022   Janssen (J&J) SARS-COV-2 Vaccination 07/06/2019   MMR 08/29/1996, 04/25/2000   Pneumococcal Conjugate-13 06/05/1999   Tdap 11/28/2007   Varicella 08/29/1996    Diagnostics/Screenings:  Physical Exam: There were no vitals taken for this visit.  General: well developed, well nourished, seated, in no evident distress Head: normocephalic and atraumatic. Oropharynx difficult to examine but appears benign. No dysmorphic features. Neck: supple Cardiovascular: regular rate and rhythm, no murmurs. Respiratory: clear to auscultation bilaterally Abdomen: bowel sounds present all four quadrants, abdomen soft, non-tender, non-distended. No hepatosplenomegaly or masses palpated.Gastrostomy tube in place size  Fr cm AMT MiniOne balloon button, site clean and dry  Musculoskeletal: no skeletal deformities or obvious scoliosis. Has contractures**** Skin: no rashes or neurocutaneous lesions  Neurologic Exam Mental Status: awake and fully alert. Has no language.  Smiles responsively. Unable to follow instructions or participate in examination Cranial Nerves: fundoscopic exam - red reflex present.  Unable to fully visualize fundus.  Pupils equal briskly reactive to light.  Turns to localize faces and objects in the periphery. Turns to localize sounds in the periphery. Facial movements are asymmetric, has lower facial weakness with drooling.  Neck flexion and extension *** abnormal with poor head  control.  Motor: truncal hypotonia.  *** spastic quadriparesis  Sensory: withdrawal x 4 Coordination: unable to adequately assess due to patient's inability to participate in examination. *** with reach for objects. Gait and Station: unable to independently stand and bear weight. Able to stand with assistance but needs constant support. Able to take a few steps but has poor balance and needs support.  Reflexes: diminished and symmetric. Toes neutral. No clonus   Impression: No diagnosis found.    Recommendations for plan of care: The patient's previous Epic records were reviewed. No recent diagnostic studies to be reviewed with the patient.  Plan until next visit: Continue medications as prescribed  Call for questions or concerns No follow-ups on file.  The medication list was reviewed and reconciled. No changes were made in the prescribed medications today. A complete medication list was provided to the patient.  No orders of the defined types were placed in this encounter.    Allergies as of 01/11/2023       Reactions   Motrin [ibuprofen] Rash        Medication List        Accurate as of January 11, 2023  7:52 AM. If you have any questions, ask your nurse or doctor.          acetaminophen 160 MG/5ML solution Commonly known as: TYLENOL Take 240 mg by mouth every 6 (six) hours as needed for fever.   ALPRAZolam 0.5 MG tablet Commonly known as: XANAX Take 1/2 tablet up to 3 times per day as needed for agitation   Amantadine HCl 100 MG tablet Take 1/2 (one-half) tablet by mouth twice daily   atorvastatin 40 MG tablet Commonly known as: LIPITOR Take 1 tablet by mouth once daily   cloNIDine HCl 0.1 MG Tb12 ER tablet Commonly known as: KAPVAY TAKE 1 TABLET BY MOUTH IN THE MORNING AND 1 IN THE EVENING AND 2 AT BEDTIME   diazepam 2 MG tablet Commonly known as: VALIUM GIVE 2 TABLETS BY MOUTH AT BEDTIME   famotidine 20 MG tablet Commonly known as: PEPCID Take  1 tablet (20 mg total) by mouth 2 (two) times daily.   hydrOXYzine 25 MG tablet Commonly known as: ATARAX Take 1 tablet by mouth three times daily as needed   levothyroxine 50 MCG tablet Commonly known as: SYNTHROID Take 1 tablet by mouth once daily   polyethylene glycol powder 17 GM/SCOOP powder Commonly known as: GLYCOLAX/MIRALAX TAKE 17 GRAMS BY MOUTH DAILY AS NEEDED FOR MODERATE CONSTIPATION   tiZANidine 2 MG tablet Commonly known as: ZANAFLEX Take 1 tablet (2 mg total) by mouth at bedtime.   zonisamide 100 MG capsule Commonly known as: ZONEGRAN TAKE 2 CAPSULES BY MOUTH AT BEDTIME            I discussed this patient's care with the multiple providers involved in her care today to develop this assessment and plan.   Total time spent  with the patient was *** minutes, of which 50% or more was spent in counseling and coordination of care.  Elveria Rising NP-C [facility] [address] [place], [place] [address] Ph. [phone] Fax [phone]

## 2023-01-12 ENCOUNTER — Encounter (INDEPENDENT_AMBULATORY_CARE_PROVIDER_SITE_OTHER): Payer: Self-pay | Admitting: Family

## 2023-02-06 ENCOUNTER — Other Ambulatory Visit (INDEPENDENT_AMBULATORY_CARE_PROVIDER_SITE_OTHER): Payer: Self-pay | Admitting: Neurology

## 2023-02-06 DIAGNOSIS — G4700 Insomnia, unspecified: Secondary | ICD-10-CM

## 2023-02-12 ENCOUNTER — Other Ambulatory Visit: Payer: Self-pay | Admitting: Family Medicine

## 2023-04-07 ENCOUNTER — Other Ambulatory Visit (INDEPENDENT_AMBULATORY_CARE_PROVIDER_SITE_OTHER): Payer: Self-pay | Admitting: Neurology

## 2023-04-07 DIAGNOSIS — G40309 Generalized idiopathic epilepsy and epileptic syndromes, not intractable, without status epilepticus: Secondary | ICD-10-CM

## 2023-05-25 ENCOUNTER — Encounter (INDEPENDENT_AMBULATORY_CARE_PROVIDER_SITE_OTHER): Payer: Self-pay

## 2023-06-02 ENCOUNTER — Ambulatory Visit: Payer: Medicare Other

## 2023-06-02 VITALS — Ht <= 58 in | Wt <= 1120 oz

## 2023-06-02 DIAGNOSIS — Z0000 Encounter for general adult medical examination without abnormal findings: Secondary | ICD-10-CM

## 2023-06-02 NOTE — Progress Notes (Signed)
 Subjective:   Abigail Hartman is a 28 y.o. who presents for a Medicare Wellness preventive visit.  Visit Complete: Virtual I connected with  Abigail Hartman on 06/02/23 by a audio enabled telemedicine application and verified that I am speaking with the correct person using two identifiers.  Patient Location: Home  Provider Location: Home Office  I discussed the limitations of evaluation and management by telemedicine. The patient expressed understanding and agreed to proceed.  Vital Signs: Because this visit was a virtual/telehealth visit, some criteria may be missing or patient reported. Any vitals not documented were not able to be obtained and vitals that have been documented are patient reported.  VideoDeclined- This patient declined Librarian, academic. Therefore the visit was completed with audio only.  AWV Questionnaire: No: Patient Medicare AWV questionnaire was not completed prior to this visit.  Cardiac Risk Factors include: sedentary lifestyle     Objective:    Today's Vitals   06/02/23 1617  Weight: 57 lb (25.9 kg)  Height: 3\' 11"  (1.194 m)   Body mass index is 18.14 kg/m.     06/02/2023    4:23 PM 05/31/2022    2:40 PM 12/18/2014    8:45 AM  Advanced Directives  Does Patient Have a Medical Advance Directive? No Yes No  Type of Advance Directive  Living will;Healthcare Power of Attorney   Does patient want to make changes to medical advance directive?  No - Patient declined   Copy of Healthcare Power of Attorney in Chart?  No - copy requested   Would patient like information on creating a medical advance directive? Yes (MAU/Ambulatory/Procedural Areas - Information given)      Current Medications (verified) Outpatient Encounter Medications as of 06/02/2023  Medication Sig   acetaminophen (TYLENOL) 160 MG/5ML solution Take 240 mg by mouth every 6 (six) hours as needed for fever.   ALPRAZolam (XANAX) 0.5 MG tablet Take 1/2 tablet up to 3  times per day as needed for agitation   Amantadine HCl 100 MG tablet Take 1/2 (one-half) tablet by mouth twice daily   atorvastatin (LIPITOR) 40 MG tablet Take 1 tablet by mouth once daily   cloNIDine HCl (KAPVAY) 0.1 MG TB12 ER tablet TAKE 1 TABLET BY MOUTH IN THE MORNING AND 1 IN THE EVENING AND 2 AT BEDTIME   diazepam (VALIUM) 2 MG tablet GIVE 2 TABLETS BY MOUTH AT BEDTIME   famotidine (PEPCID) 20 MG tablet Take 1 tablet (20 mg total) by mouth 2 (two) times daily.   hydrOXYzine (ATARAX) 25 MG tablet Take 1 tablet by mouth three times daily as needed   levothyroxine (SYNTHROID) 50 MCG tablet Take 1 tablet by mouth once daily   polyethylene glycol powder (GLYCOLAX/MIRALAX) 17 GM/SCOOP powder TAKE 17 GRAMS BY MOUTH DAILY AS NEEDED FOR MODERATE CONSTIPATION   tiZANidine (ZANAFLEX) 2 MG tablet TAKE 1 TABLET BY MOUTH AT BEDTIME   zonisamide (ZONEGRAN) 100 MG capsule TAKE 2 CAPSULES BY MOUTH AT BEDTIME   No facility-administered encounter medications on file as of 06/02/2023.    Allergies (verified) Motrin [ibuprofen]   History: Past Medical History:  Diagnosis Date   Blind    Developmental delay, severe    Hypothyroidism    Seizures (HCC)    Past Surgical History:  Procedure Laterality Date   GI TUBE  2011   History reviewed. No pertinent family history. Social History   Socioeconomic History   Marital status: Single    Spouse name: Not on file  Number of children: Not on file   Years of education: Not on file   Highest education level: Not on file  Occupational History   Not on file  Tobacco Use   Smoking status: Never    Passive exposure: Yes   Smokeless tobacco: Never  Substance and Sexual Activity   Alcohol use: No    Alcohol/week: 0.0 standard drinks of alcohol   Drug use: No   Sexual activity: Never  Other Topics Concern   Not on file  Social History Narrative   Mother Deceased 06/27/2023from Cancer   She graduated from [employer]   She enjoys listening to  music.   Social Drivers of Corporate investment banker Strain: Low Risk  (06/02/2023)   Overall Financial Resource Strain (CARDIA)    Difficulty of Paying Living Expenses: Not hard at all  Food Insecurity: No Food Insecurity (06/02/2023)   Hunger Vital Sign    Worried About Running Out of Food in the Last Year: Never true    Ran Out of Food in the Last Year: Never true  Transportation Needs: No Transportation Needs (06/02/2023)   PRAPARE - Administrator, Civil Service (Medical): No    Lack of Transportation (Non-Medical): No  Physical Activity: Inactive (06/02/2023)   Exercise Vital Sign    Days of Exercise per Week: 0 days    Minutes of Exercise per Session: 0 min  Stress: Patient Unable To Answer (06/02/2023)   [employer] of Occupational Health - Occupational Stress Questionnaire    Feeling of Stress : Patient unable to answer  Social Connections: Unknown (06/02/2023)   Social Connection and Isolation Panel [NHANES]    Frequency of Communication with Friends and Family: Patient unable to answer    Frequency of Social Gatherings with Friends and Family: Patient unable to answer    Attends Religious Services: Patient unable to answer    Active Member of Clubs or Organizations: Patient unable to answer    Attends Banker Meetings: Patient unable to answer    Marital Status: Never married    Tobacco Counseling Counseling given: Not Answered    Clinical Intake:  Pre-visit preparation completed: Yes  Pain : No/denies pain     Diabetes: No  How often do you need to have someone help you when you read instructions, pamphlets, or other written materials from your doctor or pharmacy?: 1 - Never  Interpreter Needed?: No  Comments: Assisted with visit by father Information entered by :: Kandis Fantasia LPN   Activities of Daily Living     06/02/2023    4:23 PM  In your present state of health, do you have any difficulty performing the following  activities:  Hearing? 0  Vision? 1  Difficulty concentrating or making decisions? 1  Walking or climbing stairs? 1  Dressing or bathing? 1  Doing errands, shopping? 1  Preparing Food and eating ? N  Using the Toilet? Y  In the past six months, have you accidently leaked urine? Y  Do you have problems with loss of bowel control? Y  Managing your Medications? Y  Managing your Finances? Y  Housekeeping or managing your Housekeeping? Y    Patient Care Team: Mechele Claude, MD as PCP - General (Family Medicine) Elveria Rising, NP as Nurse Practitioner (Neurology)  Indicate any recent Medical Services you may have received from other than [facility] providers in the past year (date may be approximate).     Assessment:  This is a routine wellness examination for Abigail Hartman.  Hearing/Vision screen Hearing Screening - Comments:: Denies hearing difficulties   Vision Screening - Comments:: No vision changes     Goals Addressed   None    Depression Screen     06/02/2023    4:21 PM 05/31/2022    2:23 PM 03/08/2022    1:24 PM 10/28/2020    8:11 AM 08/30/2019    3:12 PM 11/15/2018    3:03 PM 08/27/2016   10:39 AM  PHQ 2/9 Scores  PHQ - 2 Score   1 0 0 0   PHQ- 9 Score   3      Exception Documentation Medical reason Other- indicate reason in comment box     Medical reason  Not completed  caregiver refusal         Fall Risk     06/02/2023    4:22 PM 05/31/2022    2:09 PM 05/30/2022    2:39 PM 03/08/2022    1:04 PM 10/28/2020    8:11 AM  Fall Risk   Falls in the past year? 0 0 0  Exclusion - non ambulatory 0  Number falls in past yr: 0 0 0     Injury with Fall? 0 0 0     Risk for fall due to : Impaired mobility Impaired mobility;Impaired balance/gait     Follow up Falls prevention discussed;Education provided;Falls evaluation completed Falls prevention discussed;Education provided;Falls evaluation completed        Proxy-reported    MEDICARE RISK AT HOME:  Medicare Risk at Home Any stairs  in or around the home?: No If so, are there any without handrails?: No Home free of loose throw rugs in walkways, pet beds, electrical cords, etc?: Yes Adequate lighting in your home to reduce risk of falls?: Yes Life alert?: No Use of a cane, walker or w/c?: Yes Grab bars in the bathroom?: Yes Shower chair or bench in shower?: Yes Elevated toilet seat or a handicapped toilet?: Yes  TIMED UP AND GO:  Was the test performed?  No  Cognitive Function: Impaired: Cognitive status assessed by direct observation. Patient has current diagnosis of cognitive impairment.        Immunizations Immunization History  Administered Date(s) Administered   DTaP 10/19/1995, 12/21/1995, 02/29/1996, 04/25/2000   HIB (PRP-T) 10/19/1995, 12/21/1995, 02/29/1996   Hepatitis B, PED/ADOLESCENT 10/19/1995   IPV 10/19/1995, 12/21/1995, 08/29/1996, 04/25/2000   Influenza, Seasonal, Injecte, Preservative Fre 12/29/2009, 04/27/2010, 04/13/2011   Influenza,inj,Quad PF,6+ Mos 03/06/2014, 01/14/2015, 03/15/2016, 02/05/2022   Janssen (J&J) SARS-COV-2 Vaccination 07/06/2019   MMR 08/29/1996, 04/25/2000   Pneumococcal Conjugate-13 06/05/1999   Tdap 11/28/2007   Varicella 08/29/1996    Screening Tests Health Maintenance  Topic Date Due   HIV Screening  Never done   Hepatitis C Screening  Never done   Cervical Cancer Screening (Pap smear)  Never done   DTaP/Tdap/Td (6 - Td or Tdap) 11/27/2017   INFLUENZA VACCINE  10/28/2022   COVID-19 Vaccine (2 - 2024-25 season) 11/28/2022   Medicare Annual Wellness (AWV)  06/01/2024   Pneumococcal Vaccine 93-26 Years old  Completed   HPV VACCINES  Aged Out    Health Maintenance  Health Maintenance Due  Topic Date Due   HIV Screening  Never done   Hepatitis C Screening  Never done   Cervical Cancer Screening (Pap smear)  Never done   DTaP/Tdap/Td (6 - Td or Tdap) 11/27/2017   INFLUENZA VACCINE  10/28/2022   COVID-19 Vaccine (  2 - 2024-25 season) 11/28/2022     Additional Screening:  Vision Screening: Recommended annual ophthalmology exams for early detection of glaucoma and other disorders of the eye.  Dental Screening: Recommended annual dental exams for proper oral hygiene  Community Resource Referral / Chronic Care Management: CRR required this visit?  No   CCM required this visit?  No     Plan:     I have personally reviewed and noted the following in the patient's chart:   Medical and social history Use of alcohol, tobacco or illicit drugs  Current medications and supplements including opioid prescriptions. Patient is not currently taking opioid prescriptions. Functional ability and status Nutritional status Physical activity Advanced directives List of other physicians Hospitalizations, surgeries, and ER visits in previous 12 months Vitals Screenings to include cognitive, depression, and falls Referrals and appointments  In addition, I have reviewed and discussed with patient certain preventive protocols, quality metrics, and best practice recommendations. A written personalized care plan for preventive services as well as general preventive health recommendations were provided to patient.     Kandis Fantasia [place], [place]   04/03/1094   After Visit Summary: (MyChart) Due to this being a telephonic visit, the after visit summary with patients personalized plan was offered to patient via MyChart   Notes: Nothing significant to report at this time.

## 2023-06-02 NOTE — Patient Instructions (Signed)
 Ms. Dukes , Thank you for taking time to come for your Medicare Wellness Visit. I appreciate your ongoing commitment to your health goals. Please review the following plan we discussed and let me know if I can assist you in the future.   Referrals/Orders/Follow-Ups/Clinician Recommendations: Aim for 30 minutes of exercise or brisk walking, 6-8 glasses of water, and 5 servings of fruits and vegetables each day.  This is a list of the screening recommended for you and due dates:  Health Maintenance  Topic Date Due   HIV Screening  Never done   Hepatitis C Screening  Never done   Pap Smear  Never done   DTaP/Tdap/Td vaccine (6 - Td or Tdap) 11/27/2017   Flu Shot  10/28/2022   COVID-19 Vaccine (2 - 2024-25 season) 11/28/2022   Medicare Annual Wellness Visit  06/01/2024   Pneumococcal Vaccination  Completed   HPV Vaccine  Aged Out    Advanced directives: (ACP Link)Information on Advanced Care Planning can be found at [facility] [place] Advance Health Care Directives Advance Health Care Directives ([email or web address]   Next Medicare Annual Wellness Visit scheduled for next year: Yes

## 2023-06-04 ENCOUNTER — Other Ambulatory Visit (INDEPENDENT_AMBULATORY_CARE_PROVIDER_SITE_OTHER): Payer: Self-pay | Admitting: Family

## 2023-06-04 DIAGNOSIS — G4700 Insomnia, unspecified: Secondary | ICD-10-CM

## 2023-06-15 ENCOUNTER — Ambulatory Visit: Payer: Medicare Other | Admitting: Family Medicine

## 2023-06-15 NOTE — Progress Notes (Deleted)
 Subjective:  Patient ID: Abigail Hartman, female    DOB: 17-Nov-1995  Age: 28 y.o. MRN: [id number]  CC: No chief complaint on file.   HPI Abigail Hartman presents for ***     03/08/2022    1:24 PM 10/28/2020    8:11 AM 08/30/2019    3:12 PM  Depression screen PHQ 2/9  Decreased Interest 1 0 0  Down, Depressed, Hopeless 0 0 0  PHQ - 2 Score 1 0 0  Altered sleeping 0    Tired, decreased energy 1    Change in appetite 1    Feeling bad or failure about yourself  0    Trouble concentrating 0    Moving slowly or fidgety/restless 0    Suicidal thoughts 0    PHQ-9 Score 3    Difficult doing work/chores Not difficult at all      History Abigail Hartman has a past medical history of Blind, Developmental delay, severe, Hypothyroidism, and Seizures (HCC).   She has a past surgical history that includes GI TUBE (2011).   Her family history is not on file.She reports that she has never smoked. She has been exposed to tobacco smoke. She has never used smokeless tobacco. She reports that she does not drink alcohol and does not use drugs.    ROS Review of Systems  Objective:  There were no vitals taken for this visit.  BP Readings from Last 3 Encounters:  01/11/23 (!) 98/56  03/08/22 95/69  01/07/22 98/60    Wt Readings from Last 3 Encounters:  06/02/23 57 lb (25.9 kg)  01/11/23 57 lb (25.9 kg)  05/31/22 60 lb (27.2 kg)     Physical Exam    Assessment & Plan:   There are no diagnoses linked to this encounter.     I am having Abigail Hartman maintain her acetaminophen, polyethylene glycol powder, Amantadine HCl, atorvastatin, tiZANidine, ALPRAZolam, cloNIDine HCl, diazepam, famotidine, levothyroxine, zonisamide, and hydrOXYzine.  Allergies as of 06/15/2023       Reactions   Motrin [ibuprofen] Rash        Medication List        Accurate as of June 15, 2023  2:49 PM. If you have any questions, ask your nurse or doctor.          acetaminophen 160 MG/5ML solution Commonly  known as: TYLENOL Take 240 mg by mouth every 6 (six) hours as needed for fever.   ALPRAZolam 0.5 MG tablet Commonly known as: XANAX Take 1/2 tablet up to 3 times per day as needed for agitation   Amantadine HCl 100 MG tablet Take 1/2 (one-half) tablet by mouth twice daily   atorvastatin 40 MG tablet Commonly known as: LIPITOR Take 1 tablet by mouth once daily   cloNIDine HCl 0.1 MG Tb12 ER tablet Commonly known as: KAPVAY TAKE 1 TABLET BY MOUTH IN THE MORNING AND 1 IN THE EVENING AND 2 AT BEDTIME   diazepam 2 MG tablet Commonly known as: VALIUM GIVE 2 TABLETS BY MOUTH AT BEDTIME   famotidine 20 MG tablet Commonly known as: PEPCID Take 1 tablet (20 mg total) by mouth 2 (two) times daily.   hydrOXYzine 25 MG tablet Commonly known as: ATARAX Take 1 tablet by mouth three times daily as needed   levothyroxine 50 MCG tablet Commonly known as: SYNTHROID Take 1 tablet by mouth once daily   polyethylene glycol powder 17 GM/SCOOP powder Commonly known as: GLYCOLAX/MIRALAX TAKE 17 GRAMS BY MOUTH DAILY AS NEEDED FOR MODERATE  CONSTIPATION   tiZANidine 2 MG tablet Commonly known as: ZANAFLEX TAKE 1 TABLET BY MOUTH AT BEDTIME   zonisamide 100 MG capsule Commonly known as: ZONEGRAN TAKE 2 CAPSULES BY MOUTH AT BEDTIME         Follow-up: No follow-ups on file.  Mechele Claude, M.D.

## 2023-06-17 ENCOUNTER — Encounter: Payer: Self-pay | Admitting: Family Medicine

## 2023-07-04 ENCOUNTER — Ambulatory Visit: Admitting: Family Medicine

## 2023-07-04 ENCOUNTER — Other Ambulatory Visit: Payer: Self-pay | Admitting: Family Medicine

## 2023-07-04 ENCOUNTER — Encounter: Payer: Self-pay | Admitting: Family Medicine

## 2023-07-04 ENCOUNTER — Telehealth: Payer: Self-pay | Admitting: Family Medicine

## 2023-07-04 VITALS — BP 90/68 | HR 46 | Temp 97.9°F | Ht <= 58 in | Wt <= 1120 oz

## 2023-07-04 DIAGNOSIS — H543 Unqualified visual loss, both eyes: Secondary | ICD-10-CM

## 2023-07-04 DIAGNOSIS — Z23 Encounter for immunization: Secondary | ICD-10-CM

## 2023-07-04 DIAGNOSIS — G40309 Generalized idiopathic epilepsy and epileptic syndromes, not intractable, without status epilepticus: Secondary | ICD-10-CM | POA: Diagnosis not present

## 2023-07-04 DIAGNOSIS — G800 Spastic quadriplegic cerebral palsy: Secondary | ICD-10-CM

## 2023-07-04 MED ORDER — LEVOTHYROXINE SODIUM 50 MCG PO TABS: 50.0000 ug | ORAL_TABLET | Freq: Every day | ORAL | 0 refills | Status: DC

## 2023-07-04 MED ORDER — ATORVASTATIN CALCIUM 40 MG PO TABS: 40.0000 mg | ORAL_TABLET | Freq: Every day | ORAL | 2 refills | Status: DC

## 2023-07-04 NOTE — Telephone Encounter (Signed)
 Forms received, placed on PCP's desk. Appt was completed.

## 2023-07-04 NOTE — Progress Notes (Signed)
 Subjective:  Patient ID: Abigail Hartman, female    DOB: 1995/08/23  Age: 28 y.o. MRN: [id number]  CC: Medical Management of Chronic Issues (Pt presents today with dad. No concerns at this time. LS)   HPI Abigail Hartman presents for follow up.  She is a cerebral palsy patient cared for in the home by parents. [facility] providing Boost and diapers. Also getting changing pads, wipes. She has had no recent seizure. Sh can walk a short distance but is wobbly.      03/08/2022    1:24 PM 10/28/2020    8:11 AM 08/30/2019    3:12 PM  Depression screen PHQ 2/9  Decreased Interest 1 0 0  Down, Depressed, Hopeless 0 0 0  PHQ - 2 Score 1 0 0  Altered sleeping 0    Tired, decreased energy 1    Change in appetite 1    Feeling bad or failure about yourself  0    Trouble concentrating 0    Moving slowly or fidgety/restless 0    Suicidal thoughts 0    PHQ-9 Score 3    Difficult doing work/chores Not difficult at all      History Abigail Hartman has a past medical history of Blind, Developmental delay, severe, Hypothyroidism, and Seizures (HCC).   She has a past surgical history that includes GI TUBE (03/29/2009) and wisdom teeth removal (Bilateral).   Her family history is not on file.She reports that she has never smoked. She has been exposed to tobacco smoke. She has never used smokeless tobacco. She reports that she does not drink alcohol and does not use drugs.    ROS Review of Systems  Unable to perform ROS: Patient nonverbal (cerebral palsy affecting mentation)    Objective:  BP 90/68   Pulse (!) 46   Temp 97.9 F (36.6 C)   Ht 3\' 11"  (1.194 m)   Wt 58 lb (26.3 kg)   SpO2 95%   BMI 18.46 kg/m   BP Readings from Last 3 Encounters:  07/04/23 90/68  01/11/23 (!) 98/56  03/08/22 95/69    Wt Readings from Last 3 Encounters:  07/04/23 58 lb (26.3 kg)  06/02/23 57 lb (25.9 kg)  01/11/23 57 lb (25.9 kg)     Physical Exam Constitutional:      General: She is not in acute distress.     Appearance: She is well-developed.     Comments: Petite, abnormally small size   HENT:     Head: Normocephalic and atraumatic.  Eyes:     Conjunctiva/sclera: Conjunctivae normal.     Pupils: Pupils are equal, round, and reactive to light.  Neck:     Thyroid: No thyromegaly.  Cardiovascular:     Rate and Rhythm: Normal rate and regular rhythm.     Heart sounds: Normal heart sounds. No murmur heard. Pulmonary:     Effort: Pulmonary effort is normal. No respiratory distress.     Breath sounds: Normal breath sounds. No wheezing or rales.  Abdominal:     General: Bowel sounds are normal. There is no distension.     Palpations: Abdomen is soft.     Tenderness: There is no abdominal tenderness.  Musculoskeletal:        General: Normal range of motion.     Cervical back: Normal range of motion and neck supple.  Lymphadenopathy:     Cervical: No cervical adenopathy.  Skin:    General: Skin is warm and dry.  Neurological:  Mental Status: She is alert. Mental status is at baseline. She is disoriented.     Gait: Gait abnormal.      Assessment & Plan:  Spastic quadriplegic cerebral palsy (HCC) -     CBC with Differential/Platelet -     CMP14+EGFR -     Prealbumin  Blindness of both eyes -     CBC with Differential/Platelet -     CMP14+EGFR -     Prealbumin  Epilepsy, generalized, convulsive (HCC) -     CBC with Differential/Platelet -     CMP14+EGFR -     Prealbumin  Immunization due -     Tdap vaccine greater than or equal to 7yo IM  Other orders -     Atorvastatin Calcium; Take 1 tablet (40 mg total) by mouth daily.  Dispense: 90 tablet; Refill: 2 -     Levothyroxine Sodium; Take 1 tablet (50 mcg total) by mouth daily.  Dispense: 90 tablet; Refill: 0     Follow-up: Return in about 1 year (around 07/03/2024), or if symptoms worsen or fail to improve.  Mechele Claude, M.D.

## 2023-07-04 NOTE — Telephone Encounter (Signed)
 Copied from CRM [id number]. Topic: Medical Record Request - Provider/Facility Request >> Jul 04, 2023 11:52 AM Bobbye Morton wrote: Reason for CRM: Gavin Pound called in to follow up on renewal forms for drinks and incontinence supplies. Papers were faxed over 03/17 and are for todays appt 04/07 at 8:35AM. , Gavin Pound wants to know if appt was completed and wants to know when will receive a response. They expire 04/10.     Callback [phone] >> Jul 04, 2023 12:09 PM Antwanette L wrote: Gavin Pound from [facility] is calling to see if Dr. Darlyn Read received renewal forms that was faxed on 3/17. The forms needs to completed by sent by 4/10.  Gavin Pound is requesting a callback at [phone]

## 2023-07-06 NOTE — Telephone Encounter (Signed)
 Information and forms faxed back to [facility]

## 2023-07-07 ENCOUNTER — Other Ambulatory Visit (INDEPENDENT_AMBULATORY_CARE_PROVIDER_SITE_OTHER): Payer: Self-pay | Admitting: Family

## 2023-07-07 DIAGNOSIS — F72 Severe intellectual disabilities: Secondary | ICD-10-CM

## 2023-07-07 DIAGNOSIS — G4700 Insomnia, unspecified: Secondary | ICD-10-CM

## 2023-07-17 ENCOUNTER — Other Ambulatory Visit (INDEPENDENT_AMBULATORY_CARE_PROVIDER_SITE_OTHER): Payer: Self-pay | Admitting: Family

## 2023-07-17 DIAGNOSIS — G809 Cerebral palsy, unspecified: Secondary | ICD-10-CM

## 2023-07-17 DIAGNOSIS — G801 Spastic diplegic cerebral palsy: Secondary | ICD-10-CM

## 2023-07-17 DIAGNOSIS — G4700 Insomnia, unspecified: Secondary | ICD-10-CM

## 2023-10-13 ENCOUNTER — Other Ambulatory Visit: Payer: Self-pay | Admitting: Family Medicine

## 2023-11-30 ENCOUNTER — Ambulatory Visit: Payer: Self-pay

## 2023-11-30 NOTE — Telephone Encounter (Signed)
 Advised calling 911

## 2023-11-30 NOTE — Telephone Encounter (Signed)
 FYI Only or Action Required?: FYI only for provider.  Patient was last seen in primary care on 07/04/2023 by Zollie Lowers, MD.  Called Nurse Triage reporting Swallowed Foreign Body and pooped out a battery, may have 2 others in her body.  Symptoms began several days ago.  Interventions attempted: Nothing. - Pt was with other person over weekend  Symptoms are: difficult to tell per caretaker, no overt signs of distress per caretaker.  Triage Disposition: Call EMS 911 Now (overriding Go to ED Now (Notify PCP))  Patient/caregiver understands and will follow disposition?: Yes      Copied from CRM [id number]. Topic: Clinical - Red Word Triage >> Nov 30, 2023  1:27 PM Abigail Hartman wrote: Red Word that prompted transfer to Nurse Triage: patients caretaker called stated patient was playing with a toy with batteries and she swallowed al 3. She pooped out one battery but they believe she still has the other 2 in her stomach. Reason for Disposition  Swallowed button battery (or other battery; known or suspected)  Answer Assessment - Initial Assessment Questions Abigail Hartman aunt caretaker  1. OBJECT: What is it?      3 batteries in the toy, pooped out one 3. ONSET: How long ago did you swallow it? (e.g., minutes, hours)      Friday 4. HOW DID IT HAPPEN: Tell me how it happened.      Wasn'Hartman with her, was with someone else for the weekend, was playing with toy, one minute there were batteries, next there wasn'Hartman, definitely pooped out one battery 5. OTHER SYMPTOMS: Are there any other symptoms? (e.g., pain in neck or chest, difficulty breathing, difficulty swallowing)     Slept all day Saturday, didn'Hartman feel Can'Hartman tell any difference with breathing Hiney wasn'Hartman feeling good, has her soaking in tub now, can'Hartman tell if pain since pt special needs I'm freaking out   Advised hospital asap, call 911 for faster transportation, advised bring the battery. Aunt verbalized understanding and  intent, disconnected.  Protocols used: Swallowed Foreign Body-A-AH

## 2024-01-07 ENCOUNTER — Other Ambulatory Visit (INDEPENDENT_AMBULATORY_CARE_PROVIDER_SITE_OTHER): Payer: Self-pay | Admitting: Family

## 2024-01-07 DIAGNOSIS — F72 Severe intellectual disabilities: Secondary | ICD-10-CM

## 2024-01-07 DIAGNOSIS — G801 Spastic diplegic cerebral palsy: Secondary | ICD-10-CM

## 2024-01-07 DIAGNOSIS — G4700 Insomnia, unspecified: Secondary | ICD-10-CM

## 2024-01-11 NOTE — Progress Notes (Signed)
 Abigail Hartman   MRN:  [id number]  February 19, 1996   Provider: Ellouise Bollman NP-C Location of Care: 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 She does not have pituitary dysfunction. She has hypothyroidism that is managed by her PCP. She has generalized tonic-clonic seizures. She is taking and tolerating Zonisamide  for her seizure disorder and has remained seizure free since September 2016. She takes Amantadine  and Tizanidine  for spasticity and movement, Clonidine  ER for behavior and Hydroxyzine  for sleep. She can be aggressive at times, pinching or pulling hair, particularly when she is frustrated or anxious. She was adopted by her grandparents and has no contact with her biological mother. Her grandmother passed away unexpectedly in 08/09/23and Abigail Hartman is cared for by her adoptive father and her biologic aunt.   Due to her medical condition, Abigail Hartman is indefinitely incontinent of stool and urine.  It is medically necessary for her to use diapers, underpads, and gloves to assist with hygiene and skin integrity.     Today's concerns: She has remained seizure free since her last visit She was seen in the ED in September for ingesting a battery from a toy. Fortunately the battery was passed in her stool.  Behavior has not been problematic She is cared for by her father and her maternal aunt. Dad reports that Abigail Hartman has a good appetite and has no coughing or choking when consuming foods. Abigail Hartman has been otherwise generally healthy since she was last seen. No health  concerns today other than previously mentioned.  Review of systems: Please see HPI for neurologic and other pertinent review of systems. Otherwise all other systems were reviewed and were negative.  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   Insomnia 12/03/2014   Cerebral palsy (HCC) 05/07/2014   Hypoxic ischemic encephalopathy, neonatal onset, severe (HCC) 04/26/2014   Optic nerve hypoplasia of both eyes 04/26/2014   Blindness 04/26/2014   Severe intellectual disability 04/26/2014   Gastroesophageal reflux disease without esophagitis 07/18/2012   Hypothyroidism 07/18/2012     Past Medical History:  Diagnosis Date   Blind    Developmental delay, severe    Hypothyroidism    Seizures (HCC)     Past medical history comments: See HPI Copied from previous record: Her last seizure occurred on December 18, 2014; prior to that 2011; seizures were generalized tonic-clonic in nature.   She has problems with insomnia and sometimes will be awake for three or four days straight and then sleep.   I reviewed her last evaluation at [facility].  Her last video EEG was poorly organized and showed delta and beta range activity.  There were four push button events with no EEG seizure correlates and she had frequent eye movement artifact.   MRI scan of the brain at [facility] showed an absent septum pellucidum and hypoplastic optic nerves consistent with the condition noted septo-optic dysplasia.    In the past, she had numerous hospitalizations for recurrent seizures, pneumonia, and at one  point had a problem with aspiration pneumonia and a gastrostomy tube was placed for about one to two years.  It was removed about two years ago.   She is followed by Dorothyann Orange at [facility] for endocrinology and also by pediatric nephrology at [facility].   She has also been followed at [facility] by orthopedic surgery and physical therapy.  Surgical history: Past Surgical History:  Procedure Laterality Date   GI TUBE  03/29/2009   wisdom teeth removal Bilateral      Family history: family history is not on file.   Social history: Social History   Socioeconomic History   Marital status: Single    Spouse name: Not on file   Number of children: Not on file   Years of education: Not on file   Highest education level: 12th grade  Occupational History   Not on file  Tobacco Use   Smoking status: Never    Passive exposure: Yes   Smokeless tobacco: Never  Substance and Sexual Activity   Alcohol use: No    Alcohol/week: 0.0 standard drinks of alcohol   Drug use: No   Sexual activity: Never  Other Topics Concern   Not on file  Social History Narrative   Mother Deceased 07/28/2023from Cancer   She graduated from [employer]   She enjoys listening to music.   Social Drivers of Corporate investment banker Strain: Low Risk  (06/14/2023)   Overall Financial Resource Strain (CARDIA)    Difficulty of Paying Living Expenses: Not very hard  Food Insecurity: No Food Insecurity (06/14/2023)   Hunger Vital Sign    Worried About Running Out of Food in the Last Year: Never true    Ran Out of Food in the Last Year: Never true  Transportation Needs: No Transportation Needs (06/14/2023)   PRAPARE - Administrator, Civil Service (Medical): No    Lack of Transportation (Non-Medical): No  Physical Activity: Inactive (06/02/2023)   Exercise Vital Sign    Days of Exercise per Week: 0 days    Minutes of Exercise per Session: 0 min  Stress: Patient Unable To Answer (06/02/2023)   [employer] of Occupational Health - Occupational Stress Questionnaire    Feeling of Stress : Patient unable to answer  Social Connections: Socially Isolated (06/14/2023)   Social Connection and Isolation Panel    Frequency of Communication with Friends  and Family: Never    Frequency of Social Gatherings with Friends and Family: Three times a week    Attends Religious Services: Never    Active Member of Clubs or Organizations: No    Attends Banker Meetings: Patient unable to answer    Marital Status: Never married  Intimate Partner Violence: Patient Unable To Answer (06/02/2023)   Humiliation, Afraid, Rape, and Kick questionnaire    Fear of Current or Ex-Partner: Patient unable to answer    Emotionally Abused: Patient unable to answer    Physically Abused: Patient unable to answer    Sexually Abused: Patient unable to answer    Past/failed meds:  Allergies: Allergies  Allergen Reactions   Motrin [Ibuprofen] Rash    Immunizations: Immunization History  Administered Date(s) Administered   DTaP 10/19/1995, 12/21/1995, 02/29/1996, 04/25/2000   HIB (PRP-T) 10/19/1995, 12/21/1995, 02/29/1996   Hepatitis B, PED/ADOLESCENT 10/19/1995   IPV 10/19/1995, 12/21/1995, 08/29/1996, 04/25/2000   Influenza, Seasonal, Injecte, Preservative Fre 12/29/2009, 04/27/2010, 04/13/2011   Influenza,inj,Quad PF,6+ Mos 03/06/2014, 01/14/2015, 03/15/2016, 02/05/2022  Janssen (J&J) SARS-COV-2 Vaccination 07/06/2019   MMR 08/29/1996, 04/25/2000   Pneumococcal Conjugate-13 06/05/1999   Tdap 11/28/2007, 07/04/2023   Varicella 08/29/1996    Diagnostics/Screenings:  Physical Exam: BP 98/60 (BP Location: Left Arm, Patient Position: Sitting, Cuff Size: Small)   Pulse 74   Wt 64 lb (29 kg)   LMP 01/05/2024 (Exact Date)   BMI 20.37 kg/m  Wt Readings from Last 3 Encounters:  01/12/24 64 lb (29 kg)  07/04/23 58 lb (26.3 kg)  06/02/23 57 lb (25.9 kg)  General: short statured but well developed, well nourished, seated woman who appears younger than her stated age, in no evident distress Head: normocephalic and atraumatic. Oropharynx difficult to examine but appears benign. No dysmorphic features. Neck: supple Cardiovascular: regular rate and  rhythm, no murmurs. Respiratory: clear to auscultation bilaterally Abdomen: bowel sounds present all four quadrants, abdomen soft, non-tender, non-distended. No hepatosplenomegaly or masses palpated. Musculoskeletal: no skeletal deformities or obvious scoliosis. Has increased tone in the lower greater than upper extremities Skin: no rashes or neurocutaneous lesions  Neurologic Exam Mental Status: awake and fully alert. Has no language.  No eye contact with examiner. Holds a musical toy in her hands during the visit. Unable to follow instructions or participate in examination Cranial Nerves: fundoscopic exam - red reflex present.  Unable to fully visualize fundus.  Pupils equal briskly reactive to light. Does not turn to localize faces and objects in the periphery. Turns to localize sounds in the periphery. Facial movements are asymmetric, has lower facial weakness with drooling. Motor: increased tone in the lower greater than upper extremities Sensory: withdrawal x 4 Coordination: unable to adequately assess due to patient's inability to participate in examination. Does not reach for objects. Gait and Station: unable to stand and bear weight.   Impression: Epilepsy, generalized, convulsive (HCC) - Plan: zonisamide  (ZONEGRAN ) 100 MG capsule  Severe intellectual disability - Plan: cloNIDine  HCl (KAPVAY ) 0.1 MG TB12 ER tablet, diazepam  (VALIUM ) 2 MG tablet, DISCONTINUED: ALPRAZolam  (XANAX ) 0.5 MG tablet  Spastic diplegia (HCC) - Plan: Amantadine  HCl 100 MG tablet, tiZANidine  (ZANAFLEX ) 2 MG tablet  Insomnia, unspecified type - Plan: Amantadine  HCl 100 MG tablet, cloNIDine  HCl (KAPVAY ) 0.1 MG TB12 ER tablet, diazepam  (VALIUM ) 2 MG tablet, hydrOXYzine  (ATARAX ) 25 MG tablet  Cerebral palsy, unspecified type (HCC) - Plan: Amantadine  HCl 100 MG tablet  Gastroesophageal reflux disease without esophagitis - Plan: famotidine  (PEPCID ) 20 MG tablet  Hypoxic ischemic encephalopathy, neonatal onset,  severe (HCC)  Optic nerve hypoplasia of both eyes   Recommendations for plan of care: The patient's previous Epic records were reviewed. No recent diagnostic studies to be reviewed with the patient.  Plan until next visit: Continue medications as prescribed  Call for questions or concerns Return in about 1 year (around 01/11/2025).  The medication list was reviewed and reconciled. No changes were made in the prescribed medications today. A complete medication list was provided to the patient.  Allergies as of 01/12/2024       Reactions   Motrin [ibuprofen] Rash        Medication List        Accurate as of January 12, 2024 11:59 PM. If you have any questions, ask your nurse or doctor.          acetaminophen 160 MG/5ML solution Commonly known as: TYLENOL Take 240 mg by mouth every 6 (six) hours as needed for fever.   ALPRAZolam  0.5 MG tablet Commonly known as: XANAX  Take 1/2 tablet up to  3 times per day as needed for agitation   Amantadine  HCl 100 MG tablet Take 1/2 (one-half) tablet by mouth twice daily   atorvastatin  40 MG tablet Commonly known as: LIPITOR Take 1 tablet (40 mg total) by mouth daily.   cloNIDine  HCl 0.1 MG Tb12 ER tablet Commonly known as: KAPVAY  TAKE 1 TABLET BY MOUTH IN THE MORNING AND 1 IN THE EVENING AND 2 AT BEDTIME   diazepam  2 MG tablet Commonly known as: VALIUM  GIVE 2 TABLETS BY MOUTH AT BEDTIME   famotidine  20 MG tablet Commonly known as: PEPCID  Take 1 tablet (20 mg total) by mouth 2 (two) times daily.   hydrOXYzine  25 MG tablet Commonly known as: ATARAX  Take 1 tablet (25 mg total) by mouth 3 (three) times daily as needed.   levothyroxine  50 MCG tablet Commonly known as: SYNTHROID  Take 1 tablet by mouth once daily   polyethylene glycol powder 17 GM/SCOOP powder Commonly known as: GLYCOLAX /MIRALAX  TAKE 17 GRAMS BY MOUTH DAILY AS NEEDED FOR MODERATE CONSTIPATION   tiZANidine  2 MG tablet Commonly known as: ZANAFLEX  Take 1  tablet (2 mg total) by mouth at bedtime.   zonisamide  100 MG capsule Commonly known as: ZONEGRAN  Take 2 capsules (200 mg total) by mouth at bedtime.      Total time spent with the patient was 25 minutes, of which 50% or more was spent in counseling and coordination of care.  Ellouise Bollman NP-C [facility] [address] [place], [place] [address] Ph. [phone] Fax [phone]

## 2024-01-12 ENCOUNTER — Encounter (INDEPENDENT_AMBULATORY_CARE_PROVIDER_SITE_OTHER): Payer: Self-pay | Admitting: Family

## 2024-01-12 ENCOUNTER — Ambulatory Visit (INDEPENDENT_AMBULATORY_CARE_PROVIDER_SITE_OTHER): Payer: Medicare Other | Admitting: Family

## 2024-01-12 ENCOUNTER — Other Ambulatory Visit (INDEPENDENT_AMBULATORY_CARE_PROVIDER_SITE_OTHER): Payer: Self-pay | Admitting: Family

## 2024-01-12 VITALS — BP 98/60 | HR 74 | Wt <= 1120 oz

## 2024-01-12 DIAGNOSIS — G801 Spastic diplegic cerebral palsy: Secondary | ICD-10-CM

## 2024-01-12 DIAGNOSIS — K219 Gastro-esophageal reflux disease without esophagitis: Secondary | ICD-10-CM

## 2024-01-12 DIAGNOSIS — G4700 Insomnia, unspecified: Secondary | ICD-10-CM

## 2024-01-12 DIAGNOSIS — G809 Cerebral palsy, unspecified: Secondary | ICD-10-CM

## 2024-01-12 DIAGNOSIS — F72 Severe intellectual disabilities: Secondary | ICD-10-CM

## 2024-01-12 DIAGNOSIS — G40309 Generalized idiopathic epilepsy and epileptic syndromes, not intractable, without status epilepticus: Secondary | ICD-10-CM | POA: Diagnosis not present

## 2024-01-12 DIAGNOSIS — H47033 Optic nerve hypoplasia, bilateral: Secondary | ICD-10-CM

## 2024-01-12 MED ORDER — ALPRAZOLAM 0.5 MG PO TABS: ORAL_TABLET | ORAL | 5 refills | Status: DC

## 2024-01-12 MED ORDER — DIAZEPAM 2 MG PO TABS: ORAL_TABLET | ORAL | 5 refills | Status: AC

## 2024-01-12 MED ORDER — HYDROXYZINE HCL 25 MG PO TABS: 25.0000 mg | ORAL_TABLET | Freq: Three times a day (TID) | ORAL | 5 refills | Status: AC | PRN

## 2024-01-12 MED ORDER — TIZANIDINE HCL 2 MG PO TABS: 2.0000 mg | ORAL_TABLET | Freq: Every day | ORAL | 3 refills | Status: AC

## 2024-01-12 MED ORDER — AMANTADINE HCL 100 MG PO TABS: ORAL_TABLET | ORAL | 5 refills | Status: AC

## 2024-01-12 MED ORDER — ZONISAMIDE 100 MG PO CAPS: 200.0000 mg | ORAL_CAPSULE | Freq: Every day | ORAL | 3 refills | Status: AC

## 2024-01-12 MED ORDER — FAMOTIDINE 20 MG PO TABS
20.0000 mg | ORAL_TABLET | Freq: Two times a day (BID) | ORAL | 3 refills | Status: AC
Start: 2024-01-12 — End: ?

## 2024-01-12 MED ORDER — CLONIDINE HCL ER 0.1 MG PO TB12: ORAL_TABLET | ORAL | 3 refills | Status: AC

## 2024-01-12 NOTE — Patient Instructions (Signed)
 It was a pleasure to see you today!  Instructions for you until your next appointment are as follows: Continue giving Abigail Hartman's medications as prescribed Call for any questions or concerns Please sign up for MyChart if you have not done so. Please plan to return for follow up in 1 year or sooner if needed.  Feel free to contact our office during normal business hours at [phone] with questions or concerns. If there is no answer or the call is outside business hours, please leave a message and our clinic staff will call you back within the next business day.  If you have an urgent concern, please stay on the line for our after-hours answering service and ask for the on-call neurologist.     I also encourage you to use MyChart to communicate with me more directly. If you have not yet signed up for MyChart within [facility], the front desk staff can help you. However, please note that this inbox is NOT monitored on nights or weekends, and response can take up to 2 business days.  Urgent matters should be discussed with the on-call pediatric neurologist.   At [facility], we are committed to providing exceptional care. You will receive a patient satisfaction survey through text or email regarding your visit today. Your opinion is important to me. Comments are appreciated.

## 2024-01-13 ENCOUNTER — Other Ambulatory Visit (INDEPENDENT_AMBULATORY_CARE_PROVIDER_SITE_OTHER): Payer: Self-pay | Admitting: Family

## 2024-01-13 DIAGNOSIS — F72 Severe intellectual disabilities: Secondary | ICD-10-CM

## 2024-01-14 ENCOUNTER — Encounter (INDEPENDENT_AMBULATORY_CARE_PROVIDER_SITE_OTHER): Payer: Self-pay | Admitting: Family

## 2024-02-16 ENCOUNTER — Encounter (INDEPENDENT_AMBULATORY_CARE_PROVIDER_SITE_OTHER): Payer: Self-pay

## 2024-03-25 ENCOUNTER — Other Ambulatory Visit: Payer: Self-pay | Admitting: Family Medicine

## 2024-04-19 ENCOUNTER — Telehealth (INDEPENDENT_AMBULATORY_CARE_PROVIDER_SITE_OTHER): Payer: Self-pay

## 2024-04-19 NOTE — Telephone Encounter (Signed)
 Received a PA request for Amantadine , 100 MG   SS, CCMA

## 2024-04-20 ENCOUNTER — Encounter (INDEPENDENT_AMBULATORY_CARE_PROVIDER_SITE_OTHER): Payer: Self-pay | Admitting: Pharmacy Technician

## 2024-04-20 ENCOUNTER — Other Ambulatory Visit (HOSPITAL_COMMUNITY): Payer: Self-pay

## 2024-04-20 ENCOUNTER — Telehealth (INDEPENDENT_AMBULATORY_CARE_PROVIDER_SITE_OTHER): Payer: Self-pay | Admitting: Pharmacy Technician

## 2024-04-20 NOTE — Telephone Encounter (Signed)
 Pharmacy Patient Advocate Encounter  Received notification from [employer] that Prior Authorization for Amantadine  HCl 100MG  tablets  has been DENIED.  **No PA needed. Previous PA expires 03/28/25.**    PA #/Case ID/Reference #: [id number]

## 2024-04-20 NOTE — Telephone Encounter (Signed)
 Pharmacy Patient Advocate Encounter   Received notification from Pt Calls Messages that prior authorization for Amantadine  HCl 100MG  tablets  is required/requested.   Insurance verification completed.   The patient is insured through [employer].   Per test claim: PA required; PA submitted to above mentioned insurance via Latent Key/confirmation #/EOC [employer] Status is pending

## 2024-04-20 NOTE — Telephone Encounter (Signed)
 PA request has been Submitted. New Encounter has been or will be created for follow up. For additional info see Pharmacy Prior Auth telephone encounter from 04/20/24.

## 2024-04-20 NOTE — Telephone Encounter (Signed)
 error

## 2024-06-04 ENCOUNTER — Ambulatory Visit: Payer: Self-pay

## 2025-01-10 ENCOUNTER — Ambulatory Visit (INDEPENDENT_AMBULATORY_CARE_PROVIDER_SITE_OTHER): Admitting: Family
